# Patient Record
Sex: Male | Born: 1945
Health system: Southern US, Community
[De-identification: ages and names within clinical notes are randomized; demographics above are authoritative.]

## PROBLEM LIST (undated history)

## (undated) DIAGNOSIS — E7800 Pure hypercholesterolemia, unspecified: Secondary | ICD-10-CM

## (undated) DIAGNOSIS — K219 Gastro-esophageal reflux disease without esophagitis: Secondary | ICD-10-CM

## (undated) DIAGNOSIS — J189 Pneumonia, unspecified organism: Secondary | ICD-10-CM

## (undated) DIAGNOSIS — N3281 Overactive bladder: Secondary | ICD-10-CM

## (undated) DIAGNOSIS — J449 Chronic obstructive pulmonary disease, unspecified: Secondary | ICD-10-CM

## (undated) DIAGNOSIS — M1990 Unspecified osteoarthritis, unspecified site: Secondary | ICD-10-CM

## (undated) DIAGNOSIS — E1129 Type 2 diabetes mellitus with other diabetic kidney complication: Secondary | ICD-10-CM

## (undated) HISTORY — DX: Gastro-esophageal reflux disease without esophagitis: K21.9

## (undated) HISTORY — DX: Overactive bladder: N32.81

## (undated) HISTORY — PX: OTHER SURGICAL HISTORY: SHX169

## (undated) HISTORY — PX: CATARACT EXTRACTION: SUR2

## (undated) HISTORY — DX: Pure hypercholesterolemia, unspecified: E78.00

## (undated) HISTORY — DX: Type 2 diabetes mellitus with other diabetic kidney complication: E11.29

---

## 2004-09-04 ENCOUNTER — Encounter: Admission: RE | Admit: 2004-09-04 | Discharge: 2004-09-04 | Payer: Self-pay | Admitting: Family Medicine

## 2006-03-24 ENCOUNTER — Encounter: Admission: RE | Admit: 2006-03-24 | Discharge: 2006-03-24 | Payer: Self-pay | Admitting: General Surgery

## 2011-06-17 ENCOUNTER — Encounter (HOSPITAL_COMMUNITY): Payer: Self-pay | Admitting: Pharmacy Technician

## 2011-06-26 ENCOUNTER — Encounter (HOSPITAL_COMMUNITY): Payer: Self-pay

## 2011-06-26 ENCOUNTER — Encounter (HOSPITAL_COMMUNITY)
Admission: RE | Admit: 2011-06-26 | Discharge: 2011-06-26 | Disposition: A | Discharge status: Home / Self Care | Payer: Medicare Other | Source: Ambulatory Visit | Attending: Orthopaedic Surgery | Admitting: Orthopaedic Surgery

## 2011-06-26 ENCOUNTER — Other Ambulatory Visit: Payer: Self-pay

## 2011-06-26 ENCOUNTER — Ambulatory Visit (HOSPITAL_COMMUNITY)
Admission: RE | Admit: 2011-06-26 | Discharge: 2011-06-26 | Disposition: A | Discharge status: Home / Self Care | Payer: Medicare Other | Source: Ambulatory Visit | Attending: Orthopaedic Surgery | Admitting: Orthopaedic Surgery

## 2011-06-26 DIAGNOSIS — M1610 Unilateral primary osteoarthritis, unspecified hip: Secondary | ICD-10-CM | POA: Insufficient documentation

## 2011-06-26 DIAGNOSIS — Z01812 Encounter for preprocedural laboratory examination: Secondary | ICD-10-CM | POA: Insufficient documentation

## 2011-06-26 DIAGNOSIS — Z01810 Encounter for preprocedural cardiovascular examination: Secondary | ICD-10-CM | POA: Insufficient documentation

## 2011-06-26 DIAGNOSIS — M169 Osteoarthritis of hip, unspecified: Secondary | ICD-10-CM | POA: Insufficient documentation

## 2011-06-26 DIAGNOSIS — Z01818 Encounter for other preprocedural examination: Secondary | ICD-10-CM | POA: Insufficient documentation

## 2011-06-26 HISTORY — DX: Unspecified osteoarthritis, unspecified site: M19.90

## 2011-06-26 HISTORY — DX: Chronic obstructive pulmonary disease, unspecified: J44.9

## 2011-06-26 LAB — URINALYSIS, ROUTINE W REFLEX MICROSCOPIC
Bilirubin Urine: NEGATIVE
Glucose, UA: NEGATIVE mg/dL
Hgb urine dipstick: NEGATIVE
Leukocytes, UA: NEGATIVE
Nitrite: NEGATIVE
Protein, ur: NEGATIVE mg/dL
Specific Gravity, Urine: 1.016 (ref 1.005–1.030)
Urobilinogen, UA: 0.2 mg/dL (ref 0.0–1.0)
pH: 5 (ref 5.0–8.0)

## 2011-06-26 LAB — CBC
HCT: 42.2 % (ref 39.0–52.0)
Hemoglobin: 14.1 g/dL (ref 13.0–17.0)
MCH: 29.7 pg (ref 26.0–34.0)
MCHC: 33.4 g/dL (ref 30.0–36.0)
MCV: 88.8 fL (ref 78.0–100.0)
Platelets: 255 10*3/uL (ref 150–400)
RBC: 4.75 MIL/uL (ref 4.22–5.81)
RDW: 13.6 % (ref 11.5–15.5)
WBC: 8.2 10*3/uL (ref 4.0–10.5)

## 2011-06-26 LAB — PROTIME-INR
INR: 1.03 (ref 0.00–1.49)
Prothrombin Time: 13.7 seconds (ref 11.6–15.2)

## 2011-06-26 LAB — BASIC METABOLIC PANEL
BUN: 17 mg/dL (ref 6–23)
CO2: 27 mEq/L (ref 19–32)
Calcium: 9.2 mg/dL (ref 8.4–10.5)
Chloride: 102 mEq/L (ref 96–112)
Creatinine, Ser: 1.4 mg/dL — ABNORMAL HIGH (ref 0.50–1.35)
GFR calc Af Amer: 59 mL/min — ABNORMAL LOW (ref >90)
GFR calc non Af Amer: 51 mL/min — ABNORMAL LOW (ref >90)
Glucose, Bld: 73 mg/dL (ref 70–99)
Potassium: 4.7 mEq/L (ref 3.5–5.1)
Sodium: 137 mEq/L (ref 135–145)

## 2011-06-26 LAB — APTT: aPTT: 37 seconds (ref 24–37)

## 2011-06-26 LAB — SURGICAL PCR SCREEN
MRSA, PCR: NEGATIVE
Staphylococcus aureus: POSITIVE — AB

## 2011-06-26 NOTE — Patient Instructions (Addendum)
20 Christopher Turner  06/26/2011   Your procedure is scheduled on:  1.11.13 1015am-1235 pm  Report to [facility] at 0815 AM.  Call this number if you have problems the morning of surgery: [phone]   Remember:   Do not eat food:After Midnight.  May have clear liquids:until Midnight .  Clear liquids include soda, tea, black coffee, apple or grape juice, broth.  Take these medicines the morning of surgery with A SIP OF WATER:    Do not wear jewelry,   Do not wear lotions, powders, or perfumes.     Do not bring valuables to the hospital.  Contacts, dentures or bridgework may not be worn into surgery.  Leave suitcase in the car. After surgery it may be brought to your room.  For patients admitted to the hospital, checkout time is 11:00 AM the day of discharge.      Special Instructions: CHG Shower Use Special Wash: 1/2 bottle night before surgery and 1/2 bottle morning of surgery. shower chin to toes with CHG.  Wash face and private parts with regular soap.    Please read over the following fact sheets that you were given: MRSA Information, Blood Transfusion Fact Sheet , Incentive Spirometry Fact Sheet, coughing and deep breathing exercises, leg exercises

## 2011-07-03 ENCOUNTER — Inpatient Hospital Stay (HOSPITAL_COMMUNITY): Payer: Medicare Other

## 2011-07-03 ENCOUNTER — Encounter (HOSPITAL_COMMUNITY): Payer: Self-pay | Admitting: Anesthesiology

## 2011-07-03 ENCOUNTER — Inpatient Hospital Stay (HOSPITAL_COMMUNITY)
Admission: RE | Admit: 2011-07-03 | Discharge: 2011-07-06 | DRG: 470 | Disposition: A | Discharge status: Home Health | Payer: Medicare Other | Source: Ambulatory Visit | Attending: Orthopaedic Surgery | Admitting: Orthopaedic Surgery

## 2011-07-03 ENCOUNTER — Encounter (HOSPITAL_COMMUNITY): Payer: Self-pay | Admitting: *Deleted

## 2011-07-03 ENCOUNTER — Inpatient Hospital Stay (HOSPITAL_COMMUNITY): Payer: Medicare Other | Admitting: Anesthesiology

## 2011-07-03 ENCOUNTER — Encounter (HOSPITAL_COMMUNITY)
Admission: RE | Disposition: A | Discharge status: Home Health | Payer: Self-pay | Source: Ambulatory Visit | Attending: Orthopaedic Surgery

## 2011-07-03 DIAGNOSIS — J4489 Other specified chronic obstructive pulmonary disease: Secondary | ICD-10-CM | POA: Diagnosis present

## 2011-07-03 DIAGNOSIS — M1610 Unilateral primary osteoarthritis, unspecified hip: Principal | ICD-10-CM | POA: Diagnosis present

## 2011-07-03 DIAGNOSIS — J449 Chronic obstructive pulmonary disease, unspecified: Secondary | ICD-10-CM | POA: Diagnosis present

## 2011-07-03 DIAGNOSIS — M169 Osteoarthritis of hip, unspecified: Secondary | ICD-10-CM

## 2011-07-03 HISTORY — PX: TOTAL HIP ARTHROPLASTY: SHX124

## 2011-07-03 LAB — ABO/RH: ABO/RH(D): O POS

## 2011-07-03 LAB — TYPE AND SCREEN
ABO/RH(D): O POS
Antibody Screen: NEGATIVE

## 2011-07-03 SURGERY — ARTHROPLASTY, HIP, TOTAL, ANTERIOR APPROACH
Anesthesia: General | Site: Hip | Laterality: Right | Wound class: Clean

## 2011-07-03 MED ORDER — MORPHINE SULFATE (PF) 1 MG/ML IV SOLN
INTRAVENOUS | Status: DC
  Administered 2011-07-03: 3 mg via INTRAVENOUS
  Administered 2011-07-03: 1.5 mg via INTRAVENOUS
  Administered 2011-07-03: 13:00:00 via INTRAVENOUS
  Administered 2011-07-04: 3 mg via INTRAVENOUS
  Administered 2011-07-04: 6 mg via INTRAVENOUS
  Administered 2011-07-04: 1.5 mg via INTRAVENOUS
  Administered 2011-07-04: 6 mg via INTRAVENOUS
  Administered 2011-07-04: 3 mg via INTRAVENOUS
  Administered 2011-07-04: 09:00:00 via INTRAVENOUS
  Administered 2011-07-04 – 2011-07-05 (×2): 3 mg via INTRAVENOUS
  Filled 2011-07-03 (×2): qty 25

## 2011-07-03 MED ORDER — METHOCARBAMOL 100 MG/ML IJ SOLN
500.0000 mg | Freq: Four times a day (QID) | INTRAVENOUS | Status: DC | PRN
  Administered 2011-07-03: 500 mg via INTRAVENOUS
  Filled 2011-07-03: qty 5

## 2011-07-03 MED ORDER — SODIUM CHLORIDE 0.9 % IJ SOLN: 9.0000 mL | INTRAMUSCULAR | Status: DC | PRN

## 2011-07-03 MED ORDER — ONDANSETRON HCL 4 MG/2ML IJ SOLN: 4.0000 mg | Freq: Four times a day (QID) | INTRAMUSCULAR | Status: DC | PRN

## 2011-07-03 MED ORDER — MORPHINE SULFATE 2 MG/ML IJ SOLN: 1.0000 mg | INTRAMUSCULAR | Status: DC | PRN

## 2011-07-03 MED ORDER — RIVAROXABAN 10 MG PO TABS
10.0000 mg | ORAL_TABLET | Freq: Every day | ORAL | Status: DC
  Administered 2011-07-04 – 2011-07-06 (×3): 10 mg via ORAL
  Filled 2011-07-03 (×3): qty 1

## 2011-07-03 MED ORDER — NEOSTIGMINE METHYLSULFATE 1 MG/ML IJ SOLN
INTRAMUSCULAR | Status: DC | PRN
  Administered 2011-07-03: 4 mg via INTRAVENOUS

## 2011-07-03 MED ORDER — PROMETHAZINE HCL 25 MG/ML IJ SOLN: 6.2500 mg | INTRAMUSCULAR | Status: DC | PRN

## 2011-07-03 MED ORDER — ALUM & MAG HYDROXIDE-SIMETH 200-200-20 MG/5ML PO SUSP
30.0000 mL | ORAL | Status: DC | PRN
  Administered 2011-07-03: 30 mL via ORAL
  Filled 2011-07-03: qty 30

## 2011-07-03 MED ORDER — DOCUSATE SODIUM 100 MG PO CAPS
100.0000 mg | ORAL_CAPSULE | Freq: Two times a day (BID) | ORAL | Status: DC
  Administered 2011-07-03 – 2011-07-06 (×7): 100 mg via ORAL
  Filled 2011-07-03 (×8): qty 1

## 2011-07-03 MED ORDER — ACETAMINOPHEN 325 MG PO TABS: 650.0000 mg | ORAL_TABLET | Freq: Four times a day (QID) | ORAL | Status: DC | PRN

## 2011-07-03 MED ORDER — HYDROMORPHONE HCL PF 1 MG/ML IJ SOLN
0.2500 mg | INTRAMUSCULAR | Status: DC | PRN
  Administered 2011-07-03 (×2): 0.5 mg via INTRAVENOUS

## 2011-07-03 MED ORDER — MIDAZOLAM HCL 5 MG/5ML IJ SOLN
INTRAMUSCULAR | Status: DC | PRN
  Administered 2011-07-03: 2 mg via INTRAVENOUS

## 2011-07-03 MED ORDER — PHENOL 1.4 % MT LIQD: 1.0000 | OROMUCOSAL | Status: DC | PRN

## 2011-07-03 MED ORDER — MENTHOL 3 MG MT LOZG: 1.0000 | LOZENGE | OROMUCOSAL | Status: DC | PRN

## 2011-07-03 MED ORDER — METOCLOPRAMIDE HCL 5 MG/ML IJ SOLN
5.0000 mg | Freq: Three times a day (TID) | INTRAMUSCULAR | Status: DC | PRN
Start: 2011-07-03 — End: 2011-07-06

## 2011-07-03 MED ORDER — LACTATED RINGERS IV SOLN: INTRAVENOUS | Status: DC

## 2011-07-03 MED ORDER — CEFAZOLIN SODIUM 1-5 GM-% IV SOLN
1.0000 g | Freq: Four times a day (QID) | INTRAVENOUS | Status: AC
  Administered 2011-07-03 – 2011-07-04 (×3): 1 g via INTRAVENOUS
  Filled 2011-07-03 (×3): qty 50

## 2011-07-03 MED ORDER — EPHEDRINE SULFATE 50 MG/ML IJ SOLN
INTRAMUSCULAR | Status: DC | PRN
  Administered 2011-07-03 (×2): 10 mg via INTRAVENOUS

## 2011-07-03 MED ORDER — DIPHENHYDRAMINE HCL 12.5 MG/5ML PO ELIX: 12.5000 mg | ORAL_SOLUTION | ORAL | Status: DC | PRN

## 2011-07-03 MED ORDER — METHOCARBAMOL 500 MG PO TABS
500.0000 mg | ORAL_TABLET | Freq: Four times a day (QID) | ORAL | Status: DC | PRN
  Administered 2011-07-04: 500 mg via ORAL
  Filled 2011-07-03: qty 1

## 2011-07-03 MED ORDER — MEPERIDINE HCL 50 MG/ML IJ SOLN: 6.2500 mg | INTRAMUSCULAR | Status: DC | PRN

## 2011-07-03 MED ORDER — GLYCOPYRROLATE 0.2 MG/ML IJ SOLN
INTRAMUSCULAR | Status: DC | PRN
  Administered 2011-07-03: .6 mg via INTRAVENOUS

## 2011-07-03 MED ORDER — CEFAZOLIN SODIUM-DEXTROSE 2-3 GM-% IV SOLR
2.0000 g | Freq: Once | INTRAVENOUS | Status: AC
  Administered 2011-07-03: 2 g via INTRAVENOUS

## 2011-07-03 MED ORDER — SODIUM CHLORIDE 0.9 % IV SOLN
INTRAVENOUS | Status: DC
  Administered 2011-07-03 – 2011-07-04 (×3): via INTRAVENOUS

## 2011-07-03 MED ORDER — HYDROCODONE-ACETAMINOPHEN 5-325 MG PO TABS: 1.0000 | ORAL_TABLET | ORAL | Status: DC | PRN

## 2011-07-03 MED ORDER — METOCLOPRAMIDE HCL 10 MG PO TABS
5.0000 mg | ORAL_TABLET | Freq: Three times a day (TID) | ORAL | Status: DC | PRN
Start: 2011-07-03 — End: 2011-07-06

## 2011-07-03 MED ORDER — KETAMINE HCL 10 MG/ML IJ SOLN
INTRAMUSCULAR | Status: DC | PRN
  Administered 2011-07-03 (×2): 10 mg via INTRAVENOUS
  Administered 2011-07-03: 20 mg via INTRAVENOUS

## 2011-07-03 MED ORDER — ZOLPIDEM TARTRATE 5 MG PO TABS: 5.0000 mg | ORAL_TABLET | Freq: Every evening | ORAL | Status: DC | PRN

## 2011-07-03 MED ORDER — ACETAMINOPHEN 650 MG RE SUPP: 650.0000 mg | Freq: Four times a day (QID) | RECTAL | Status: DC | PRN

## 2011-07-03 MED ORDER — ONDANSETRON HCL 4 MG/2ML IJ SOLN
INTRAMUSCULAR | Status: DC | PRN
  Administered 2011-07-03: 4 mg via INTRAVENOUS

## 2011-07-03 MED ORDER — ACETAMINOPHEN 10 MG/ML IV SOLN
INTRAVENOUS | Status: DC | PRN
  Administered 2011-07-03: 1000 mg via INTRAVENOUS

## 2011-07-03 MED ORDER — CISATRACURIUM BESYLATE 2 MG/ML IV SOLN
INTRAVENOUS | Status: DC | PRN
  Administered 2011-07-03: 2 mg via INTRAVENOUS
  Administered 2011-07-03: 12 mg via INTRAVENOUS
  Administered 2011-07-03: 2 mg via INTRAVENOUS

## 2011-07-03 MED ORDER — 0.9 % SODIUM CHLORIDE (POUR BTL) OPTIME
TOPICAL | Status: DC | PRN
  Administered 2011-07-03: 1000 mL

## 2011-07-03 MED ORDER — DIPHENHYDRAMINE HCL 12.5 MG/5ML PO ELIX: 12.5000 mg | ORAL_SOLUTION | Freq: Four times a day (QID) | ORAL | Status: DC | PRN

## 2011-07-03 MED ORDER — OXYCODONE HCL 5 MG PO TABS
5.0000 mg | ORAL_TABLET | ORAL | Status: DC | PRN
Start: 2011-07-03 — End: 2011-07-06

## 2011-07-03 MED ORDER — PROPOFOL 10 MG/ML IV EMUL
INTRAVENOUS | Status: DC | PRN
  Administered 2011-07-03: 150 mg via INTRAVENOUS

## 2011-07-03 MED ORDER — LACTATED RINGERS IV SOLN
INTRAVENOUS | Status: DC
  Administered 2011-07-03 (×2): via INTRAVENOUS
  Administered 2011-07-03: 1000 mL via INTRAVENOUS

## 2011-07-03 MED ORDER — DIPHENHYDRAMINE HCL 50 MG/ML IJ SOLN: 12.5000 mg | Freq: Four times a day (QID) | INTRAMUSCULAR | Status: DC | PRN

## 2011-07-03 MED ORDER — SUFENTANIL CITRATE 50 MCG/ML IV SOLN
INTRAVENOUS | Status: DC | PRN
  Administered 2011-07-03: 20 ug via INTRAVENOUS
  Administered 2011-07-03 (×3): 10 ug via INTRAVENOUS

## 2011-07-03 MED ORDER — NALOXONE HCL 0.4 MG/ML IJ SOLN: 0.4000 mg | INTRAMUSCULAR | Status: DC | PRN

## 2011-07-03 MED ORDER — LIDOCAINE HCL (CARDIAC) 20 MG/ML IV SOLN
INTRAVENOUS | Status: DC | PRN
  Administered 2011-07-03: 100 mg via INTRAVENOUS

## 2011-07-03 MED ORDER — ONDANSETRON HCL 4 MG PO TABS: 4.0000 mg | ORAL_TABLET | Freq: Four times a day (QID) | ORAL | Status: DC | PRN

## 2011-07-03 SURGICAL SUPPLY — 34 items
BAG ZIPLOCK 12X15 (MISCELLANEOUS) ×4 IMPLANT
BLADE SAW SGTL 18X1.27X75 (BLADE) ×2 IMPLANT
CELLS DAT CNTRL 66122 CELL SVR (MISCELLANEOUS) ×1 IMPLANT
CLOTH BEACON ORANGE TIMEOUT ST (SAFETY) ×2 IMPLANT
DRAPE C-ARM 42X72 X-RAY (DRAPES) ×2 IMPLANT
DRAPE STERI IOBAN 125X83 (DRAPES) ×2 IMPLANT
DRAPE U-SHAPE 47X51 STRL (DRAPES) ×6 IMPLANT
DRSG MEPILEX BORDER 4X8 (GAUZE/BANDAGES/DRESSINGS) ×2 IMPLANT
DURAPREP 26ML APPLICATOR (WOUND CARE) ×2 IMPLANT
ELECT BLADE TIP CTD 4 INCH (ELECTRODE) ×2 IMPLANT
ELECT REM PT RETURN 9FT ADLT (ELECTROSURGICAL) ×2
ELECTRODE REM PT RTRN 9FT ADLT (ELECTROSURGICAL) ×1 IMPLANT
FACESHIELD LNG OPTICON STERILE (SAFETY) ×8 IMPLANT
GAUZE XEROFORM 1X8 LF (GAUZE/BANDAGES/DRESSINGS) ×2 IMPLANT
GLOVE BIO SURGEON STRL SZ7 (GLOVE) ×2 IMPLANT
GLOVE BIO SURGEON STRL SZ7.5 (GLOVE) ×2 IMPLANT
GLOVE BIOGEL PI IND STRL 7.5 (GLOVE) IMPLANT
GLOVE BIOGEL PI IND STRL 8 (GLOVE) ×1 IMPLANT
GLOVE BIOGEL PI INDICATOR 7.5 (GLOVE)
GLOVE BIOGEL PI INDICATOR 8 (GLOVE) ×1
GLOVE ECLIPSE 7.0 STRL STRAW (GLOVE) ×2 IMPLANT
GOWN STRL REIN XL XLG (GOWN DISPOSABLE) ×4 IMPLANT
KIT BASIN OR (CUSTOM PROCEDURE TRAY) ×2 IMPLANT
PACK TOTAL JOINT (CUSTOM PROCEDURE TRAY) ×2 IMPLANT
PADDING CAST COTTON 6X4 STRL (CAST SUPPLIES) ×2 IMPLANT
RTRCTR WOUND ALEXIS 18CM MED (MISCELLANEOUS) ×2
STAPLER SKIN PROX WIDE 3.9 (STAPLE) ×2 IMPLANT
SUT ETHIBOND NAB CT1 #1 30IN (SUTURE) ×4 IMPLANT
SUT VIC AB 1 CT1 36 (SUTURE) ×4 IMPLANT
SUT VIC AB 2-0 CT1 27 (SUTURE) ×2
SUT VIC AB 2-0 CT1 TAPERPNT 27 (SUTURE) ×2 IMPLANT
TOWEL OR 17X26 10 PK STRL BLUE (TOWEL DISPOSABLE) ×4 IMPLANT
TOWEL OR NON WOVEN STRL DISP B (DISPOSABLE) ×2 IMPLANT
TRAY FOLEY CATH 14FRSI W/METER (CATHETERS) ×2 IMPLANT

## 2011-07-03 NOTE — Anesthesia Postprocedure Evaluation (Signed)
  Anesthesia Post-op Note  Patient: Christopher Turner  Procedure(s) Performed:  TOTAL HIP ARTHROPLASTY ANTERIOR APPROACH  Patient Location: PACU  Anesthesia Type: General  Level of Consciousness: awake and alert   Airway and Oxygen Therapy: Patient Spontanous Breathing  Post-op Pain: mild  Post-op Assessment: Post-op Vital signs reviewed, Patient's Cardiovascular Status Stable, Respiratory Function Stable, Patent Airway and No signs of Nausea or vomiting  Post-op Vital Signs: stable  Complications: No apparent anesthesia complications

## 2011-07-03 NOTE — Transfer of Care (Signed)
Immediate Anesthesia Transfer of Care Note  Patient: Christopher Turner  Procedure(s) Performed:  TOTAL HIP ARTHROPLASTY ANTERIOR APPROACH  Patient Location: PACU  Anesthesia Type: General  Level of Consciousness: sedated  Airway & Oxygen Therapy: Patient Spontanous Breathing and Patient connected to face mask oxygen  Post-op Assessment: Report given to PACU RN and Post -op Vital signs reviewed and stable  Post vital signs: Reviewed and stable Filed Vitals:   07/03/11 0730  BP: 136/82  Pulse: 67  Temp: 36.6 C  Resp: 20    Complications: No apparent anesthesia complications

## 2011-07-03 NOTE — Anesthesia Procedure Notes (Signed)
Procedure Name: Intubation Date/Time: 07/03/2011 10:09 AM Performed by: Lurlean Leyden, Mycah Formica L. Patient Re-evaluated:Patient Re-evaluated prior to inductionOxygen Delivery Method: Circle System Utilized Preoxygenation: Pre-oxygenation with 100% oxygen Intubation Type: IV induction Ventilation: Mask ventilation without difficulty and Oral airway inserted - appropriate to patient size Laryngoscope Size: Miller and 3 Grade View: Grade I Tube type: Oral Tube size: 8.0 mm Number of attempts: 1 Airway Equipment and Method: stylet Placement Confirmation: ETT inserted through vocal cords under direct vision,  breath sounds checked- equal and bilateral and positive ETCO2 Secured at: 22 cm Tube secured with: Tape Dental Injury: Teeth and Oropharynx as per pre-operative assessment

## 2011-07-03 NOTE — Preoperative (Signed)
Beta Blockers   Reason not to administer Beta Blockers:Not Applicable 

## 2011-07-03 NOTE — Anesthesia Preprocedure Evaluation (Addendum)
Anesthesia Evaluation  Patient identified by MRN, date of birth, ID band Patient awake    Reviewed: Allergy & Precautions, H&P , NPO status , Patient's Chart, lab work & pertinent test results  Airway Mallampati: II TM Distance: >3 FB Neck ROM: Full    Dental No notable dental hx. (+) Partial Upper and Partial Lower   Pulmonary neg pulmonary ROS, COPD (mild) clear to auscultation  Pulmonary exam normal       Cardiovascular neg cardio ROS Regular Normal    Neuro/Psych Negative Neurological ROS  Negative Psych ROS   GI/Hepatic negative GI ROS, Neg liver ROS,   Endo/Other  Negative Endocrine ROS  Renal/GU negative Renal ROS  Genitourinary negative   Musculoskeletal negative musculoskeletal ROS (+)   Abdominal   Peds negative pediatric ROS (+)  Hematology negative hematology ROS (+)   Anesthesia Other Findings   Reproductive/Obstetrics negative OB ROS                          Anesthesia Physical Anesthesia Plan  ASA: II  Anesthesia Plan: General   Post-op Pain Management:    Induction: Intravenous  Airway Management Planned: Oral ETT  Additional Equipment:   Intra-op Plan:   Post-operative Plan: Extubation in OR  Informed Consent: I have reviewed the patients History and Physical, chart, labs and discussed the procedure including the risks, benefits and alternatives for the proposed anesthesia with the patient or authorized representative who has indicated his/her understanding and acceptance.   Dental advisory given  Plan Discussed with: CRNA  Anesthesia Plan Comments:         Anesthesia Quick Evaluation

## 2011-07-03 NOTE — Brief Op Note (Signed)
07/03/2011  12:02 PM  PATIENT:  Lamar Blinks  66 y.o. male  PRE-OPERATIVE DIAGNOSIS:  Right Hip Osteoarthritis/Degenerative Joint Disease  POST-OPERATIVE DIAGNOSIS:  right hip osteoarthritis/degenerative joint  PROCEDURE:  Procedure(s): TOTAL HIP ARTHROPLASTY ANTERIOR APPROACH  SURGEON:  Surgeon(s): Kathryne Hitch  PHYSICIAN ASSISTANT:   ASSISTANTS: none   ANESTHESIA:   general  EBL:  Total I/O In: 2000 [I.V.:2000] Out: 975 [Urine:375; Blood:600]  BLOOD ADMINISTERED:none  DRAINS: none   LOCAL MEDICATIONS USED:  NONE  SPECIMEN:  No Specimen  DISPOSITION OF SPECIMEN:  N/A  COUNTS:  YES  TOURNIQUET:  * No tourniquets in log *  DICTATION: .Other Dictation: Dictation Number [id number]  PLAN OF CARE: Admit to inpatient   PATIENT DISPOSITION:  PACU - hemodynamically stable.   Delay start of Pharmacological VTE agent (>24hrs) due to surgical blood loss or risk of bleeding:  {YES/NO/NOT APPLICABLE:20182

## 2011-07-03 NOTE — H&P (Signed)
Christopher Turner is an 66 y.o. male.   Chief Complaint:   Severe right hip pain with known end-stage OA HPI: 66 yo with right hip pain and bone-on-bone wear on x-ray.  Failed conservative non-op tx.  Wishes to proceed with a right total hip replaecment.  Understands fully the risks and benefits.  Past Medical History  Diagnosis Date  . COPD (chronic obstructive pulmonary disease)     23 yrs ago dx wtih COPD no problems now   . Arthritis     arthritis right hip     Past Surgical History  Procedure Date  . Other surgical history     trapped nerve surgery right scapula as teenager   . Other surgical history     vesicocele     No family history on file. Social History:  reports that he has quit smoking. He has never used smokeless tobacco. He reports that he does not drink alcohol or use illicit drugs.  Allergies:  Allergies  Allergen Reactions  . Sulfa Antibiotics Anaphylaxis    Medications Prior to Admission  Medication Dose Route Frequency Provider Last Rate Last Dose  . ceFAZolin (ANCEF) IVPB 2 g/50 mL premix  2 g Intravenous Once Kathryne Hitch       No current outpatient prescriptions on file as of 07/03/2011.    No results found for this or any previous visit (from the past 48 hour(s)). No results found.  Review of Systems  All other systems reviewed and are negative.    Blood pressure 136/82, pulse 67, temperature 97.8 F (36.6 C), resp. rate 20, SpO2 97.00%. Physical Exam  Constitutional: He is oriented to person, place, and time. He appears well-developed and well-nourished.  HENT:  Head: Normocephalic and atraumatic.  Eyes: EOM are normal. Pupils are equal, round, and reactive to light.  Neck: Normal range of motion. Neck supple.  Cardiovascular: Normal rate, regular rhythm and normal heart sounds.   Respiratory: Effort normal and breath sounds normal.  GI: Soft. Bowel sounds are normal.  Musculoskeletal:       Right hip: He exhibits decreased range  of motion, bony tenderness and crepitus.  Neurological: He is alert and oriented to person, place, and time.  Skin: Skin is warm and dry.  Psychiatric: He has a normal mood and affect.     Assessment/Plan To the OR today for a right total hip replacement then admission as an inpatient.  Murle Otting Y 07/03/2011, 7:41 AM

## 2011-07-04 LAB — BASIC METABOLIC PANEL
BUN: 14 mg/dL (ref 6–23)
CO2: 28 mEq/L (ref 19–32)
Calcium: 8 mg/dL — ABNORMAL LOW (ref 8.4–10.5)
Chloride: 104 mEq/L (ref 96–112)
Creatinine, Ser: 1.16 mg/dL (ref 0.50–1.35)
GFR calc Af Amer: 75 mL/min — ABNORMAL LOW (ref >90)
GFR calc non Af Amer: 64 mL/min — ABNORMAL LOW (ref >90)
Glucose, Bld: 133 mg/dL — ABNORMAL HIGH (ref 70–99)
Potassium: 3.6 mEq/L (ref 3.5–5.1)
Sodium: 137 mEq/L (ref 135–145)

## 2011-07-04 LAB — CBC
HCT: 32.4 % — ABNORMAL LOW (ref 39.0–52.0)
Hemoglobin: 10.8 g/dL — ABNORMAL LOW (ref 13.0–17.0)
MCH: 29.4 pg (ref 26.0–34.0)
MCHC: 33.3 g/dL (ref 30.0–36.0)
MCV: 88.3 fL (ref 78.0–100.0)
Platelets: 200 10*3/uL (ref 150–400)
RBC: 3.67 MIL/uL — ABNORMAL LOW (ref 4.22–5.81)
RDW: 13.7 % (ref 11.5–15.5)
WBC: 7.1 10*3/uL (ref 4.0–10.5)

## 2011-07-04 NOTE — Progress Notes (Signed)
CM spoke with pt concerning d/c planning. Per pt choice [facility] to provide HHPT. [facility] rep Talmadge Coventry notified of referral. Per pt has home DME, no other HH services needed. Spouse to assist in home care.  Leonie Green [phone]

## 2011-07-04 NOTE — Progress Notes (Signed)
Subjective: 1 Day Post-Op Procedure(s) (LRB): TOTAL HIP ARTHROPLASTY ANTERIOR APPROACH (Right) Patient was out of bed to bathroom last night    Objective: Vital signs in last 24 hours: Temp:  [97.2 F (36.2 C)-98.4 F (36.9 C)] 98.4 F (36.9 C) (01/11 2120) Pulse Rate:  [61-91] 91  (01/11 2120) Resp:  [9-20] 14  (01/12 0412) BP: (105-136)/(63-83) 107/68 mmHg (01/11 2120) SpO2:  [94 %-100 %] 97 % (01/12 0412) FiO2 (%):  [95 %] 95 % (01/11 1953) Weight:  [81.194 kg (179 lb)] 81.194 kg (179 lb) (01/11 1330)  Intake/Output from previous day: 01/11 0701 - 01/12 0700 In: 3275 [P.O.:400; I.V.:2700; IV Piggyback:100] Out: 1575 [Urine:975; Blood:600] Intake/Output this shift: Total I/O In: -  Out: 600 [Urine:600]  No results found for this basename: HGB:5 in the last 72 hours No results found for this basename: WBC:2,RBC:2,HCT:2,PLT:2 in the last 72 hours No results found for this basename: NA:2,K:2,CL:2,CO2:2,BUN:2,CREATININE:2,GLUCOSE:2,CALCIUM:2 in the last 72 hours No results found for this basename: LABPT:2,INR:2 in the last 72 hours  Neurologically intact  Assessment/Plan: 1 Day Post-Op Procedure(s) (LRB): TOTAL HIP ARTHROPLASTY ANTERIOR APPROACH (Right) Up with therapy  Kada Friesen V 07/04/2011, 5:17 AM

## 2011-07-04 NOTE — Progress Notes (Signed)
Physical Therapy Evaluation Patient Details Name: Christopher Turner MRN: [id number] DOB: 1945/12/10 Today's Date: 07/04/2011 11:57-12:20 PT Eval 2  Problem List:  Patient Active Problem List  Diagnoses  . Degenerative arthritis of hip    Past Medical History:  Past Medical History  Diagnosis Date  . COPD (chronic obstructive pulmonary disease)     23 yrs ago dx wtih COPD no problems now   . Arthritis     arthritis right hip    Past Surgical History:  Past Surgical History  Procedure Date  . Other surgical history     trapped nerve surgery right scapula as teenager   . Other surgical history     vesicocele     PT Assessment/Plan/Recommendation PT Assessment Clinical Impression Statement: Pt doing very well for POD 1. Expect good progress. Pt reports stiffness in R hip decreased with walking & that he was pleasantly surprised with how far he could walk.  PT Recommendation/Assessment: Patient will need skilled PT in the acute care venue PT Problem List: Decreased strength;Decreased activity tolerance;Decreased mobility;Decreased knowledge of use of DME;Pain PT Therapy Diagnosis : Difficulty walking;Acute pain PT Plan PT Frequency: 7X/week PT Treatment/Interventions: DME instruction;Gait training;Therapeutic activities;Therapeutic exercise;Functional mobility training;Patient/family education PT Recommendation Recommendations for Other Services: OT consult Follow Up Recommendations: Home health PT (if needed) Equipment Recommended: None recommended by PT PT Goals  Acute Rehab PT Goals PT Goal Formulation: With patient Time For Goal Achievement: 4 days Pt will go Supine/Side to Sit: with modified independence PT Goal: Supine/Side to Sit - Progress: Not met Pt will go Sit to Stand: with modified independence PT Goal: Sit to Stand - Progress: Not met Pt will go Stand to Sit: with modified independence;with upper extremity assist PT Goal: Stand to Sit - Progress: Not met Pt  will Ambulate: >150 feet;with modified independence;with rolling walker PT Goal: Ambulate - Progress: Not met Pt will Perform Home Exercise Program: Independently PT Goal: Perform Home Exercise Program - Progress: Not met  PT Evaluation Precautions/Restrictions  Precautions Precaution Comments: none-direct anterior hip Required Braces or Orthoses: No Restrictions Weight Bearing Restrictions: No (WBAT) Prior Functioning  Home Living Lives With: Spouse Receives Help From: Family Type of Home: House Home Layout: One level Home Access: Level entry Home Adaptive Equipment: Walker - rolling;Tub transfer bench;Bedside commode/3-in-1 Prior Function Level of Independence: Independent with basic ADLs;Independent with gait;Independent with transfers Able to Take Stairs?: Yes Driving: Yes Vocation: Full time employment Vocation RequirementsHydrologist at [employer] Cognition Arousal/Alertness: Awake/alert Overall Cognitive Status: Appears within functional limits for tasks assessed Orientation Level: Oriented X4 Sensation/Coordination Sensation Light Touch: Appears Intact Coordination Gross Motor Movements are Fluid and Coordinated: Yes Fine Motor Movements are Fluid and Coordinated: Yes Extremity Assessment RUE Assessment RUE Assessment: Within Functional Limits LUE Assessment LUE Assessment: Within Functional Limits RLE Assessment RLE Assessment: Exceptions to [facility] RLE Strength Right Hip Flexion: 2-/5 LLE Assessment LLE Assessment: Within Functional Limits Mobility (including Balance) Bed Mobility Bed Mobility: Yes Supine to Sit: 4: Min assist;HOB flat;With rails Supine to Sit Details (indicate cue type and reason): pt 80%, used LLE to assist RLE Transfers Transfers: Yes Sit to Stand: 4: Min assist;From bed;From elevated surface;With upper extremity assist Sit to Stand Details (indicate cue type and reason): pt 75% Stand to Sit: 5: Supervision;To chair/3-in-1;With  armrests;With upper extremity assist Stand to Sit Details: VCs hand placement Ambulation/Gait Ambulation/Gait: Yes Ambulation/Gait Assistance: 5: Supervision Ambulation/Gait Assistance Details (indicate cue type and reason): VCs for sequencing and for flexed neck  Ambulation Distance (Feet): 85 Feet Assistive device: Rolling walker Gait Pattern: Step-to pattern    Exercise    End of Session PT - End of Session Equipment Utilized During Treatment: Gait belt Activity Tolerance: Patient tolerated treatment well Patient left: in chair Nurse Communication: Mobility status for transfers;Mobility status for ambulation General Behavior During Session: [facility] for tasks performed Cognition: [facility] for tasks performed  Tamala Ser 07/04/2011, 1:39 PM  Tamala Ser PT 07/04/2011  [phone]

## 2011-07-04 NOTE — Progress Notes (Signed)
Physical Therapy Treatment Patient Details Name: Christopher Turner MRN: [id number] DOB: 1946/01/03 Today's Date: 07/04/2011 14:00-14:10 TE  PT Assessment/Plan  PT - Assessment/Plan Comments on Treatment Session: Pt fatigued and wants to wait on walking. Performed bed exercises. PT Plan: Discharge plan remains appropriate PT Frequency: 7X/week Recommendations for Other Services: OT consult Follow Up Recommendations: Home health PT Equipment Recommended: None recommended by PT PT Goals  Acute Rehab PT Goals PT Goal Formulation: With patient Time For Goal Achievement: 4 days Pt will go Supine/Side to Sit: with modified independence PT Goal: Supine/Side to Sit - Progress: Not met Pt will go Sit to Stand: with modified independence PT Goal: Sit to Stand - Progress: Not met Pt will go Stand to Sit: with modified independence;with upper extremity assist PT Goal: Stand to Sit - Progress: Not met Pt will Ambulate: >150 feet;with modified independence;with rolling walker PT Goal: Ambulate - Progress: Not met Pt will Perform Home Exercise Program: Independently PT Goal: Perform Home Exercise Program - Progress: Not met  PT Treatment Precautions/Restrictions  Precautions Precaution Comments: none-direct anterior hip Required Braces or Orthoses: No Restrictions Weight Bearing Restrictions: No Mobility (including Balance) Bed Mobility Bed Mobility: Yes Supine to Sit: 4: Min assist;HOB flat;With rails Supine to Sit Details (indicate cue type and reason): pt 80%, used LLE to assist RLE Transfers Transfers: Yes Sit to Stand: 4: Min assist;From bed;From elevated surface;With upper extremity assist Sit to Stand Details (indicate cue type and reason): pt 75% Stand to Sit: 5: Supervision;To chair/3-in-1;With armrests;With upper extremity assist Stand to Sit Details: VCs hand placement Ambulation/Gait Ambulation/Gait: No (pt tired and wants to rest) Ambulation/Gait Assistance: 5:  Supervision Ambulation/Gait Assistance Details (indicate cue type and reason): VCs for sequencing and for flexed neck Ambulation Distance (Feet): 85 Feet Assistive device: Rolling walker Gait Pattern: Step-to pattern    Exercise  Total Joint Exercises Ankle Circles/Pumps: AROM;Both;10 reps;Supine Quad Sets: AROM;Right;5 reps;Supine Short Arc Quad: AROM;Right;10 reps;Supine Heel Slides: AAROM;Right;15 reps;Supine Hip ABduction/ADduction: AAROM;Right;10 reps;Supine Long Arc Quad: AROM;Right;10 reps;Seated End of Session PT - End of Session Equipment Utilized During Treatment: Gait belt Activity Tolerance: Patient tolerated treatment well Patient left: in bed;with call bell in reach Nurse Communication: Mobility status for transfers;Mobility status for ambulation General Behavior During Session: [facility] for tasks performed Cognition: [facility] for tasks performed  Tamala Ser 07/04/2011, 2:16 PM Tamala Ser PT 07/04/2011  [phone]

## 2011-07-05 LAB — CBC
HCT: 33.9 % — ABNORMAL LOW (ref 39.0–52.0)
Hemoglobin: 11.4 g/dL — ABNORMAL LOW (ref 13.0–17.0)
MCH: 29.9 pg (ref 26.0–34.0)
MCHC: 33.6 g/dL (ref 30.0–36.0)
MCV: 89 fL (ref 78.0–100.0)
Platelets: 192 10*3/uL (ref 150–400)
RBC: 3.81 MIL/uL — ABNORMAL LOW (ref 4.22–5.81)
RDW: 13.8 % (ref 11.5–15.5)
WBC: 8.9 10*3/uL (ref 4.0–10.5)

## 2011-07-05 NOTE — Progress Notes (Signed)
Subjective: 2 Days Post-Op Procedure(s) (LRB): TOTAL HIP ARTHROPLASTY ANTERIOR APPROACH (Right) Patient feels good with ambulation    Objective: Vital signs in last 24 hours: Temp:  [99.4 F (37.4 C)-99.8 F (37.7 C)] 99.8 F (37.7 C) (01/12 2140) Pulse Rate:  [90-102] 102  (01/12 2140) Resp:  [16-18] 18  (01/12 2140) BP: (111-137)/(70-81) 137/81 mmHg (01/12 2140) SpO2:  [90 %-98 %] 92 % (01/13 0657)  Intake/Output from previous day: 01/12 0701 - 01/13 0700 In: 2062.8 [P.O.:720; I.V.:1342.8] Out: 3050 [Urine:3050] Intake/Output this shift:     Basename 07/05/11 0522 07/04/11 0610  HGB 11.4* 10.8*    Basename 07/05/11 0522 07/04/11 0610  WBC 8.9 7.1  RBC 3.81* 3.67*  HCT 33.9* 32.4*  PLT 192 200    Basename 07/04/11 0610  NA 137  K 3.6  CL 104  CO2 28  BUN 14  CREATININE 1.16  GLUCOSE 133*  CALCIUM 8.0*   No results found for this basename: LABPT:2,INR:2 in the last 72 hours  Neurologically intact  Assessment/Plan: 2 Days Post-Op Procedure(s) (LRB): TOTAL HIP ARTHROPLASTY ANTERIOR APPROACH (Right) Up with therapy Plan for d/c Monday D/c iv and pca  Christopher Turner V 07/05/2011, 7:11 AM

## 2011-07-05 NOTE — Op Note (Signed)
NAMEABRAM, SAX                 ACCOUNT NO.:  [id number]  MEDICAL RECORD NO.:  [id number]  LOCATION:  1615                         FACILITY:  [facility]  PHYSICIAN:  Vanita Panda. Magnus Ivan, M.D.DATE OF BIRTH:  1945/09/02  DATE OF PROCEDURE:  07/03/2011 DATE OF DISCHARGE:                              OPERATIVE REPORT   PREOPERATIVE DIAGNOSIS:  Severe osteoarthritis and degenerative joint disease, left hip.  POSTOPERATIVE DIAGNOSIS:  Severe osteoarthritis and degenerative joint disease, left hip.  PROCEDURE:  Left total hip arthroplasty through direct anterior approach.  IMPLANTS:  DePuy Sector Gription acetabular component, size 54, size 36 + 4 neutral polyethylene liner, size 11 Corail femoral component with standard offset (KA), size 36 - 2 metal hip ball.  SURGEON:  Vanita Panda. Magnus Ivan, M.D.  ANESTHESIA:  General.  BLOOD LOSS:  600 cc.  COMPLICATIONS:  None.  INDICATIONS:  Mr. Halling is a very active 66 year old gentleman with endstage arthritis involving his right hip.  It is hurting for sometime now and has gotten progressively worse.  He wished to proceed with a total hip arthroplasty given the risk and benefits of surgery, which he fully understands.  He understands the potential for acute blood loss anemia, DVT, and PE and he wishes to proceed with the surgery.  The goals are increased activity, decreased pain, and improved quality of life.  The risks and benefits again has been well-understood and he does wish to proceed.  PROCEDURE DESCRIPTION:  After informed consent was obtained, the appropriate right hip was marked.  He was taken to the operating room. General anesthesia was obtained while he was on a stretcher and a Foley catheter was placed and then he was placed in traction boots and put supine on the Hanna fracture table with both feet in in-line skeletal traction and no traction applied.  A perineal post was placed as well. His right hip was then  prepped and draped with DuraPrep and sterile drapes.  A time-out was called and he was identified as the correct patient and correct right hip.  I then made incision just distal and posterior to the anterosuperior iliac spine and carried this obliquely down the leg.  I then proceeded to dissect through the tensor fascia lata and proceeded with a direct anterior approach to the hip.  A Cobra retractor was placed around the lateral neck and then up under the rectus femoris, a medial retractor was placed.  I coagulated lateral femoral circumflex vessels and then I made my capsular hip incision and put the Cobra retractors within the capsular hip incision.  Next, I used oscillating saw to make my femoral neck cut proximal to the lesser trochanter and then I completed this with an osteotome.  I put a cord screw guide in the femoral head and removed this in its entirety.  I then cleaned the acetabulum from debris including soft tissue and began reaming from size 44 all the way up to a size 53 reamer with the last 2 reamers placed under direct fluoroscopy and visualization as well.  I then placed the real size 54 acetabular component and a hole eliminator guide.  I then placed the real size 54  polyethylene liner.  The acetabulum again was viewed under direct fluoroscopy to get my inclination and anteversion.  I then turned attention to the femur.  All traction was off the leg and a temporary hook was placed within the vastus just underneath the femur.  The hip was externally rotated to 90 degrees, extended, and adducted to allow access to the femoral canal.  I cleaned the proximal femur debris including releasing the lateral capsule and piriformis.  I then began broaching from a size 8 broach all the way up to a size 11.  The 11 was really solid and I debrided this under direct fluoroscopic guidance.  I then trialed a 36 + 1.5 head followed by 36 - 2 and 36 - 2 actually gave him more appropriate  leg length.  I then removed all trial components and placed the real HA coated femoral component, size 11 from Corail, which is a DePuy product and a real 36 - 2 metal hip ball and reduced this back in the acetabulum.  He was stable to internal and external rotation, flexion- extension, and had minimal shuck.  His leg lengths were measured to be almost equal.  I then copiously irrigated the soft tissue and closed the joint capsule with interrupted #1 Ethibond suture.  I reapproximated the tensor fascia lata with a running #1 Vicryl followed by a 2-0 Vicryl in subcutaneous tissue and staples on the skin.  Xeroform followed by well- padded sterile dressing was applied.  He was awakened, extubated, and taken to recovery room in stable condition.  All final counts were correct.  There were no complications noted.     Vanita Panda. Magnus Ivan, M.D.     CYB/MEDQ  D:  07/03/2011  T:  07/05/2011  Job:  [id number]

## 2011-07-05 NOTE — Progress Notes (Signed)
PT Cancellation Note Pt has been up OOB all day, walking in the room.  He was having OT this am upon attempt to see for PT. This pm, he is back in bed and does not want to get up again.  He states he has no problem with walking and is ready to go home tomorrow. Ebony Hail, PT

## 2011-07-05 NOTE — Progress Notes (Signed)
Occupational Therapy Evaluation Patient Details Name: Christopher Turner MRN: [id number] DOB: 31-May-1946 Today's Date: 07/05/2011  Problem List:  Patient Active Problem List  Diagnoses  . Degenerative arthritis of hip    Past Medical History:  Past Medical History  Diagnosis Date  . COPD (chronic obstructive pulmonary disease)     23 yrs ago dx wtih COPD no problems now   . Arthritis     arthritis right hip    Past Surgical History:  Past Surgical History  Procedure Date  . Other surgical history     trapped nerve surgery right scapula as teenager   . Other surgical history     vesicocele     OT Assessment/Plan/Recommendation OT Assessment Clinical Impression Statement: Pt presents to OT s/p RTHA (anterior approach). No further OT needed for this pt. Education complete.  Wife will Assist as needed OT Recommendation/Assessment: Patient does not need any further OT services OT Recommendation Follow Up Recommendations: No OT follow up Equipment Recommended: None recommended by OT     OT Evaluation Precautions/Restrictions  Precautions Precautions: Anterior Hip Precaution Comments: no precautions Required Braces or Orthoses: No Restrictions Weight Bearing Restrictions: No Prior Functioning Home Living Bathroom Shower/Tub: Tub/shower unit;Walk-in shower Bathroom Toilet: Handicapped height Home Adaptive Equipment: Grab bars in shower;Grab bars around toilet Prior Function Comments: pt is Social research officer, 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 [address] ADL ADL Grooming: Performed;Wash/dry hands Where Assessed - Grooming: Standing at sink Upper Body Bathing: Performed Where Assessed - Upper Body Bathing: Sitting, bed;Unsupported Lower Body Bathing: Performed;Minimal assistance Where Assessed - Lower Body Bathing: Sit to stand from bed Upper Body Dressing: Performed;Supervision/safety Where Assessed - Upper Body Dressing: Sitting, bed;Unsupported Lower Body Dressing: Minimal  assistance;Performed Lower Body Dressing Details (indicate cue type and reason): wife wiill assist as needed Where Assessed - Lower Body Dressing: Sit to stand from bed Toilet Transfer: Performed Toilet Transfer Method: Proofreader: Regular height toilet;Grab bars Toileting - Clothing Manipulation: Supervision/safety Where Assessed - Glass blower/designer Manipulation: Standing Toileting - Hygiene: Supervision/safety Where Assessed - Toileting Hygiene: Standing Tub/Shower Transfer: Landscape architect Details (indicate cue type and reason): simulated walk in shower as pt has at home Tub/Shower Transfer Method: Ambulating Ambulation Related to ADLs: pt needed verbal cues to put R leg out in front for sit to stand.   ADL Comments: pt doing well!  wife will assist as needed upon DC    Cognition Cognition Arousal/Alertness: Awake/alert Overall Cognitive Status: Appears within functional limits for tasks assessed Orientation Level: Oriented X4    Extremity Assessment RUE Assessment RUE Assessment: Within Functional Limits LUE Assessment LUE Assessment: Within Functional Limits Mobility  Bed Mobility Supine to Sit: 6: Modified independent (Device/Increase time) Transfers Sit to Stand: 6: Modified independent (Device/Increase time) Stand to Sit: 6: Modified independent (Device/Increase time)    End of Session OT - End of Session Activity Tolerance: Patient tolerated treatment well Patient left: in chair;with call bell in reach General Behavior During Session: [facility] for tasks performed Cognition: [facility] for tasks performed   Sirena Riddle, [place] Kung 07/05/2011, 10:21 AM

## 2011-07-06 LAB — CBC
HCT: 33 % — ABNORMAL LOW (ref 39.0–52.0)
Hemoglobin: 11.3 g/dL — ABNORMAL LOW (ref 13.0–17.0)
MCH: 30.1 pg (ref 26.0–34.0)
MCHC: 34.2 g/dL (ref 30.0–36.0)
MCV: 88 fL (ref 78.0–100.0)
Platelets: 208 10*3/uL (ref 150–400)
RBC: 3.75 MIL/uL — ABNORMAL LOW (ref 4.22–5.81)
RDW: 13.6 % (ref 11.5–15.5)
WBC: 9 10*3/uL (ref 4.0–10.5)

## 2011-07-06 MED ORDER — OXYCODONE-ACETAMINOPHEN 5-325 MG PO TABS: 1.0000 | ORAL_TABLET | ORAL | Status: AC | PRN

## 2011-07-06 MED ORDER — RIVAROXABAN 10 MG PO TABS: 10.0000 mg | ORAL_TABLET | Freq: Every day | ORAL | Status: DC

## 2011-07-06 MED ORDER — METHOCARBAMOL 500 MG PO TABS: 500.0000 mg | ORAL_TABLET | Freq: Four times a day (QID) | ORAL | Status: AC | PRN

## 2011-07-06 NOTE — Progress Notes (Signed)
Physical Therapy Treatment Patient Details Name: LYNDOL VANDERHEIDEN MRN: [id number] DOB: 12-06-1945 Today's Date: 07/06/2011  [phone] G PT Assessment/Plan  PT - Assessment/Plan Comments on Treatment Session: Pt tolerated increased ambulation during am session.  Completed all exercises with cuing.  Ready for D/C today.  PT Plan: Discharge plan remains appropriate PT Frequency: 7X/week Follow Up Recommendations: Home health PT Equipment Recommended: None recommended by OT PT Goals  Acute Rehab PT Goals PT Goal Formulation: With patient Time For Goal Achievement: 4 days Pt will go Supine/Side to Sit: with modified independence PT Goal: Supine/Side to Sit - Progress: Met Pt will go Sit to Stand: with modified independence PT Goal: Sit to Stand - Progress: Met Pt will go Stand to Sit: with modified independence;with upper extremity assist PT Goal: Stand to Sit - Progress: Met Pt will Ambulate: >150 feet;with modified independence;with rolling walker PT Goal: Ambulate - Progress: Partly met Pt will Perform Home Exercise Program: Independently PT Goal: Perform Home Exercise Program - Progress: Partly met  PT Treatment Precautions/Restrictions  Precautions Precautions: Anterior Hip Precaution Comments: no precautions Required Braces or Orthoses: No Restrictions Weight Bearing Restrictions: No Mobility (including Balance) Bed Mobility Bed Mobility: Yes Supine to Sit: 6: Modified independent (Device/Increase time) Supine to Sit Details (indicate cue type and reason): Pt able to assist RLE with UE Transfers Transfers: Yes Sit to Stand: 6: Modified independent (Device/Increase time) Sit to Stand Details (indicate cue type and reason): Pt able to stand properly w/o cuing Stand to Sit: 6: Modified independent (Device/Increase time) Stand to Sit Details: Pt use UE properly, no cues required.  Ambulation/Gait Ambulation/Gait: Yes Ambulation/Gait Assistance: 5: Supervision Ambulation/Gait  Assistance Details (indicate cue type and reason): Required cues for sequencing/technique when making turns, cues for upright posture.  Ambulation Distance (Feet): 200 Feet Assistive device: Rolling walker Gait Pattern: Step-to pattern Gait velocity: [facility]    Exercise  Total Joint Exercises Ankle Circles/Pumps: AROM;Both;5 reps;Seated Quad Sets: AROM;Right;5 reps;Seated Short Arc Quad: AROM;Right;5 reps;Seated Heel Slides: AROM;Right;5 reps;Seated Straight Leg Raises: AAROM;Right;5 reps;Seated End of Session PT - End of Session Activity Tolerance: Patient tolerated treatment well Patient left: in chair;with call bell in reach General Behavior During Session: [facility] for tasks performed Cognition: [facility] for tasks performed  Page, Meribeth Mattes 07/06/2011, 10:00 AM

## 2011-07-06 NOTE — Discharge Summary (Signed)
Patient ID: Christopher Turner MRN: [id number] DOB/AGE: January 03, 1946 66 y.o.  Admit date: 07/03/2011 Discharge date: 07/06/2011  Admission Diagnoses:  Principal Problem:  *Degenerative arthritis of hip   Discharge Diagnoses:  Same  Past Medical History  Diagnosis Date  . COPD (chronic obstructive pulmonary disease)     23 yrs ago dx wtih COPD no problems now   . Arthritis     arthritis right hip     Surgeries: Procedure(s): TOTAL HIP ARTHROPLASTY ANTERIOR APPROACH on 07/03/2011   Consultants:    Discharged Condition: Improved  Hospital Course: Christopher Turner is an 66 y.o. male who was admitted 07/03/2011 for operative treatment ofDegenerative arthritis of hip. Patient has severe unremitting pain that affects sleep, daily activities, and work/hobbies. After pre-op clearance the patient was taken to the operating room on 07/03/2011 and underwent  Procedure(s): TOTAL HIP ARTHROPLASTY ANTERIOR APPROACH.    Patient was given perioperative antibiotics: Anti-infectives     Start     Dose/Rate Route Frequency Ordered Stop   07/03/11 1600   ceFAZolin (ANCEF) IVPB 1 g/50 mL premix        1 g 100 mL/hr over 30 Minutes Intravenous Every 6 hours 07/03/11 1338 07/04/11 0441   07/03/11 0730   ceFAZolin (ANCEF) IVPB 2 g/50 mL premix        2 g 100 mL/hr over 30 Minutes Intravenous  Once 07/03/11 0729 07/03/11 1010           Patient was given sequential compression devices, early ambulation, and chemoprophylaxis to prevent DVT.  Patient benefited maximally from hospital stay and there were no complications.    Recent vital signs: Patient Vitals for the past 24 hrs:  BP Temp Temp src Pulse Resp SpO2  07/06/11 0518 100/63 mmHg 98.1 F (36.7 C) Oral 91  18  91 %  07-18-2011 2142 131/76 mmHg 98.7 F (37.1 C) Oral 91  18  91 %  Jul 18, 2011 1520 120/72 mmHg 98.4 F (36.9 C) Oral 91  18  91 %     Recent laboratory studies:  Basename 07/06/11 0415 July 18, 2011 0522 07/04/11 0610  WBC 9.0 8.9 --    HGB 11.3* 11.4* --  HCT 33.0* 33.9* --  PLT 208 192 --  NA -- -- 137  K -- -- 3.6  CL -- -- 104  CO2 -- -- 28  BUN -- -- 14  CREATININE -- -- 1.16  GLUCOSE -- -- 133*  INR -- -- --  CALCIUM -- -- 8.0*     Discharge Medications:  Current Discharge Medication List    START taking these medications   Details  methocarbamol (ROBAXIN) 500 MG tablet Take 1 tablet (500 mg total) by mouth every 6 (six) hours as needed. Qty: 60 tablet, Refills: 0    oxyCODONE-acetaminophen (ROXICET) 5-325 MG per tablet Take 1 tablet by mouth every 4 (four) hours as needed for pain. Qty: 60 tablet, Refills: 0    rivaroxaban (XARELTO) 10 MG TABS tablet Take 1 tablet (10 mg total) by mouth daily with breakfast. Qty: 20 tablet, Refills: 0      STOP taking these medications     ibuprofen (ADVIL,MOTRIN) 200 MG tablet      traMADol (ULTRAM) 50 MG tablet      naproxen sodium (ANAPROX) 220 MG tablet         Diagnostic Studies: Dg Chest 2 View  06/26/2011  *RADIOLOGY REPORT*  Clinical Data: Osteoarthritis of the right hip.  Preoperative respiratory exam.  CHEST -  2 VIEW  Comparison: 09/04/2004  Findings: The heart size and vascularity are normal and the lungs are clear.  No significant osseous abnormality.  IMPRESSION: No acute disease in the chest.  Original Report Authenticated By: Gwynn Burly, M.D.   Dg Hip Complete Right  07/03/2011  *RADIOLOGY REPORT*  Clinical Data: Hip pain  RIGHT HIP - COMPLETE 2+ VIEW  Comparison: None.  Findings: C-arm films document satisfactory placement of right total hip arthroplasty.  IMPRESSION: No adverse features.  Original Report Authenticated By: Elsie Stain, M.D.   Dg Pelvis Portable  07/03/2011  *RADIOLOGY REPORT*  Clinical Data: Postop.  Postop right hip.  PORTABLE PELVIS  Comparison: None.  Findings: AP view of the pelvis shows right hip arthroplasty. Surgical clips overlie the right hip.  Surgical postoperative gas is identified in the region joint.  No  evidence for acute fracture, subluxation.  Regional bowel gas pattern is nonobstructive. Degenerative changes are seen in the lower spine and left hip. Multiple pelvic phleboliths are present.  IMPRESSION: Status post right hip arthroplasty.  No adverse features.  Original Report Authenticated By: Patterson Hammersmith, M.D.   Dg Hip Portable 1 View Right  07/03/2011  *RADIOLOGY REPORT*  Clinical Data: Postop for right hip arthroplasty.  PORTABLE RIGHT HIP - 1 VIEW  Comparison: 07/03/2011 at 1240 hours.  Findings: 2 lateral views.  These demonstrate a right hip arthroplasty, without acute complication or periprosthetic fracture.  Lucency within the cortex of the posterior aspect proximal femur is favored to represent a nutrient foramen.  IMPRESSION: Expected appearance on lateral view of right hip arthroplasty. Probable nutrient foramen within the proximal femur, at the level of the distal portion of the femoral component.  Original Report Authenticated By: Consuello Bossier, M.D.   Dg C-arm 50-120 Min-no Report  07/03/2011  CLINICAL DATA: right hip arthroplasty   C-ARM 61-120 MINUTES  Fluoroscopy was utilized by the requesting physician.  No radiographic  interpretation.      Disposition: Final discharge disposition not confirmed    Follow-up Information    Follow up with [facility] . (Home Physical Therapy)    Contact information:   [phone]          Signed: Kathryne Hitch 07/06/2011, 7:01 AM

## 2011-07-06 NOTE — Progress Notes (Signed)
Discharge summary sent to payer through [facility]  

## 2011-07-06 NOTE — Progress Notes (Signed)
  CARE MANAGEMENT NOTE 07/06/2011  Patient:  Christopher Turner, Christopher Turner   Account Number:  [id number]  Date Initiated:  07/06/2011  Documentation initiated by:  Colleen Can  Subjective/Objective Assessment:   DX RT HIP OSTEOARTHRITIS , DEGENERATIVE JOINT DISEASE; TOTAL HIP REPLACEMNT -ANTERIOR APPROACH     Action/Plan:   Pt plans to go back to his home in [place], [place] where his spouse will be caregiver. He is requesting [facility] for HHPT. No dme needs   Anticipated DC Date:  07/06/2011   Anticipated DC Plan:  HOME W HOME HEALTH SERVICES      DC Planning Services  CM consult      [facility] Choice  HOME HEALTH   Choice offered to / List presented to:  C-1 Patient        HH arranged  HH-2 PT      [facility] agency  [facility]   Status of service:  Completed, signed off Medicare Important Message given?  NA - LOS <3 / Initial given by admissions (If response is "NO", the following Medicare IM given date fields will be blank) Date Medicare IM given:   Date Additional Medicare IM given:    Discharge Disposition:  HOME W HOME HEALTH SERVICES  Per UR Regulation:    Comments:  07/06/2011 [name] BSN CCM [phone] Copyof hh agencies given to patient and copy placed in shadow chart. HH services to start day after discharge.

## 2011-07-07 ENCOUNTER — Encounter (HOSPITAL_COMMUNITY): Payer: Self-pay | Admitting: Orthopaedic Surgery

## 2014-03-14 ENCOUNTER — Encounter: Payer: Self-pay | Admitting: Gastroenterology

## 2014-03-22 ENCOUNTER — Ambulatory Visit (AMBULATORY_SURGERY_CENTER): Payer: Self-pay | Admitting: *Deleted

## 2014-03-22 VITALS — Ht 72.0 in | Wt 208.6 lb

## 2014-03-22 DIAGNOSIS — Z1211 Encounter for screening for malignant neoplasm of colon: Secondary | ICD-10-CM

## 2014-03-22 MED ORDER — MOVIPREP 100 G PO SOLR: ORAL | Status: DC

## 2014-03-22 NOTE — Progress Notes (Signed)
No allergies to eggs or soy. No problems with anesthesia.  Pt given Emmi instructions for colonoscopy  No oxygen use  No diet drug use  

## 2014-04-06 ENCOUNTER — Encounter: Payer: Medicare Other | Admitting: Gastroenterology

## 2018-02-09 ENCOUNTER — Other Ambulatory Visit: Payer: Self-pay | Admitting: Family Medicine

## 2018-02-09 DIAGNOSIS — Z136 Encounter for screening for cardiovascular disorders: Secondary | ICD-10-CM

## 2018-02-14 ENCOUNTER — Ambulatory Visit
Admission: RE | Admit: 2018-02-14 | Discharge: 2018-02-14 | Disposition: A | Discharge status: Home / Self Care | Payer: Medicare Other | Source: Ambulatory Visit | Attending: Family Medicine | Admitting: Family Medicine

## 2018-02-14 DIAGNOSIS — Z136 Encounter for screening for cardiovascular disorders: Secondary | ICD-10-CM

## 2018-02-18 ENCOUNTER — Telehealth: Payer: Self-pay

## 2018-02-18 ENCOUNTER — Other Ambulatory Visit: Payer: Self-pay | Admitting: Family Medicine

## 2018-02-18 DIAGNOSIS — N6320 Unspecified lump in the left breast, unspecified quadrant: Secondary | ICD-10-CM

## 2018-02-18 NOTE — Telephone Encounter (Signed)
Notes on file.  

## 2018-02-24 ENCOUNTER — Ambulatory Visit
Admission: RE | Admit: 2018-02-24 | Discharge: 2018-02-24 | Disposition: A | Discharge status: Home / Self Care | Payer: Medicare Other | Source: Ambulatory Visit | Attending: Family Medicine | Admitting: Family Medicine

## 2018-02-24 ENCOUNTER — Ambulatory Visit: Payer: Medicare Other

## 2018-02-24 DIAGNOSIS — N6320 Unspecified lump in the left breast, unspecified quadrant: Secondary | ICD-10-CM

## 2018-03-18 ENCOUNTER — Encounter: Payer: Self-pay | Admitting: Internal Medicine

## 2018-03-18 ENCOUNTER — Ambulatory Visit: Payer: Medicare Other | Admitting: Internal Medicine

## 2018-03-18 VITALS — BP 132/80 | HR 63 | Ht 72.0 in | Wt 202.1 lb

## 2018-03-18 DIAGNOSIS — R0600 Dyspnea, unspecified: Secondary | ICD-10-CM

## 2018-03-18 DIAGNOSIS — E782 Mixed hyperlipidemia: Secondary | ICD-10-CM

## 2018-03-18 NOTE — Progress Notes (Signed)
Cardiology Office Note   Date:  03/18/2018   ID:  Christopher, Turner August 12, 1945, MRN [id number]  PCP:  Tracey Harries, MD  Cardiologist:   Dietrich Pates, MD    Pt referred for SOB by    History of Present Illness: Christopher Turner is a 72 y.o. male with a history of DM and HL    Pt noticed this summer developed SOB when heat started (June) then got worse  Couldn't mow hill   Bought riding mower Seen in medicine clinic   Told to lay off acitvity until seen ni cards Labs drawn at that visit  Hgn 8.8    Given iron     Since then he says he feels great  Walking   Mowed lawn with push mower last night  No problems   No CP   No dizziness    Has colonoscopy scheduled next week        Current Meds  Medication Sig  . atorvastatin (LIPITOR) 10 MG tablet Take 10 mg by mouth daily.  . metFORMIN (GLUCOPHAGE) 500 MG tablet Take 500 mg by mouth 2 (two) times daily with a meal.     Allergies:   Sulfa antibiotics   Past Medical History:  Diagnosis Date  . Arthritis    arthritis right hip   . COPD (chronic obstructive pulmonary disease) (HCC)    23 yrs ago dx wtih COPD no problems now   . DM type 2 causing renal disease (HCC)   . Elevated LDL cholesterol level   . GERD (gastroesophageal reflux disease)   . OAB (overactive bladder)     Past Surgical History:  Procedure Laterality Date  . CATARACT EXTRACTION Bilateral 2008, 2010  . OTHER SURGICAL HISTORY     trapped nerve surgery right scapula as teenager   . OTHER SURGICAL HISTORY     vesicocele   . TOTAL HIP ARTHROPLASTY  07/03/2011   Procedure: TOTAL HIP ARTHROPLASTY ANTERIOR APPROACH;  Surgeon: Kathryne Hitch;  Location: [facility];  Service: Orthopedics;  Laterality: Right;     Social History:  The patient  reports that he quit smoking about 29 years ago. He has never used smokeless tobacco. He reports that he does not drink alcohol or use drugs.   Family History:  The patient's family history includes CVA in his brother,  maternal grandfather, paternal grandfather, paternal grandmother, and sister; Cancer - Lung in his father; Diabetes Mellitus II in his maternal grandmother, mother, and sister; Hypertension in his sister.    ROS:  Please see the history of present illness. All other systems are reviewed and  Negative to the above problem except as noted.    PHYSICAL EXAM: VS:  BP 132/80   Pulse 63   Ht 6' (1.829 m)   Wt 202 lb 1.9 oz (91.7 kg)   SpO2 98%   BMI 27.41 kg/m   GEN: Well nourished, well developed, in no acute distress  HEENT: normal  Neck: no JVD, carotid bruits, or masses Cardiac: RRR; no murmurs, rubs, or gallops,no edema  Respiratory:  clear to auscultation bilaterally, normal work of breathing GI: soft, nontender, nondistended, + BS  No hepatomegaly  MS: no deformity Moving all extremities   Skin: warm and dry, no rash Neuro:  Strength and sensation are intact Psych: euthymic mood, full affect   EKG:  EKG is ordered today.  SR 60 bpm     Lipid Panel No results found for: CHOL, TRIG, HDL, CHOLHDL,  VLDL, LDLCALC, LDLDIRECT    Wt Readings from Last 3 Encounters:  03/18/18 202 lb 1.9 oz (91.7 kg)  03/22/14 208 lb 9.6 oz (94.6 kg)  07/03/11 179 lb (81.2 kg)      ASSESSMENT AND PLAN:  1  Dyspnea  Probably related to anemia   Pt denies now that received iron    I do not think there is any active ischemia    Continue acitvities as tolerated    Colonoscopy planned  2  DM  On metformin  3  HL   Lipdis excellent in Aug   LD L47   HLDL 43   Continue  Stay active   Exercise From a cardiac standpoint he is at low risk for any procedure planned  F/U if symptoms change     Current medicines are reviewed at length with the patient today.  The patient does not have concerns regarding medicines.  Signed, Dietrich Pates, MD  03/18/2018 11:55 AM    [facility] [address] [place], [place], [place]  [address] Phone: [phone]; Fax: [phone]

## 2018-03-18 NOTE — Patient Instructions (Signed)
Medication Instructions: Your physician recommends that you continue on your current medications as directed. Please refer to the Current Medication list given to you today.   Labwork:None ordered   Testing/Procedures: None ordered   Follow-Up: None needed     Any Other Special Instructions Will Be Listed Below (If Applicable).     If you need a refill on your cardiac medications before your next appointment, please call your pharmacy.

## 2018-03-21 NOTE — Addendum Note (Signed)
Addended by: Madalyn Rob A on: 03/21/2018 01:12 PM   Modules accepted: Orders

## 2018-08-17 ENCOUNTER — Other Ambulatory Visit: Payer: Self-pay | Admitting: Family Medicine

## 2018-08-17 ENCOUNTER — Ambulatory Visit
Admission: RE | Admit: 2018-08-17 | Discharge: 2018-08-17 | Disposition: A | Discharge status: Home / Self Care | Payer: Medicare Other | Source: Ambulatory Visit | Attending: Family Medicine | Admitting: Family Medicine

## 2018-08-17 DIAGNOSIS — R519 Headache, unspecified: Secondary | ICD-10-CM

## 2018-08-17 DIAGNOSIS — R51 Headache: Principal | ICD-10-CM

## 2020-03-21 IMAGING — US ULTRASOUND LEFT BREAST LIMITED
1 series · 7 of 7 positions shown · non-contrast
Comparison: Previous exam(s).

ACR Breast Density Category a: The breast tissue is almost entirely
fatty.

CLINICAL DATA: Palpable lump in the medial left breast.

EXAM:
DIGITAL DIAGNOSTIC BILATERAL MAMMOGRAM WITH CAD AND TOMO
ULTRASOUND LEFT BREAST

[Series 1: ultrasound left breast limited · 0.06mm/px · 7 of 7 slices shown]
[im 1/7]
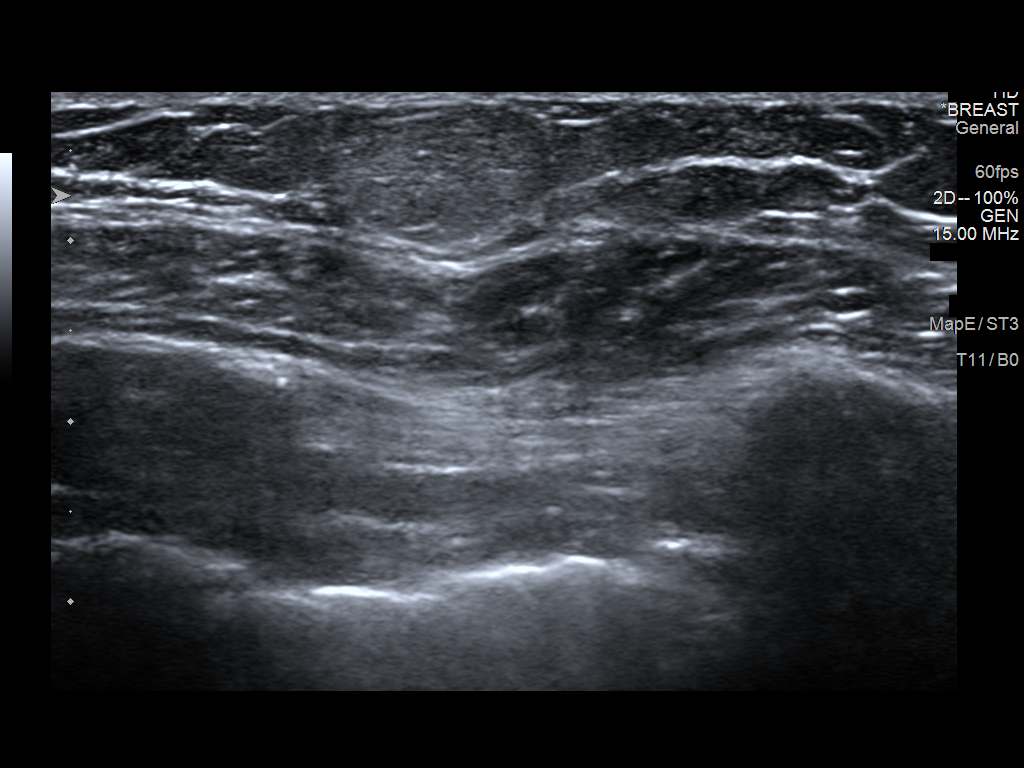
[im 2/7]
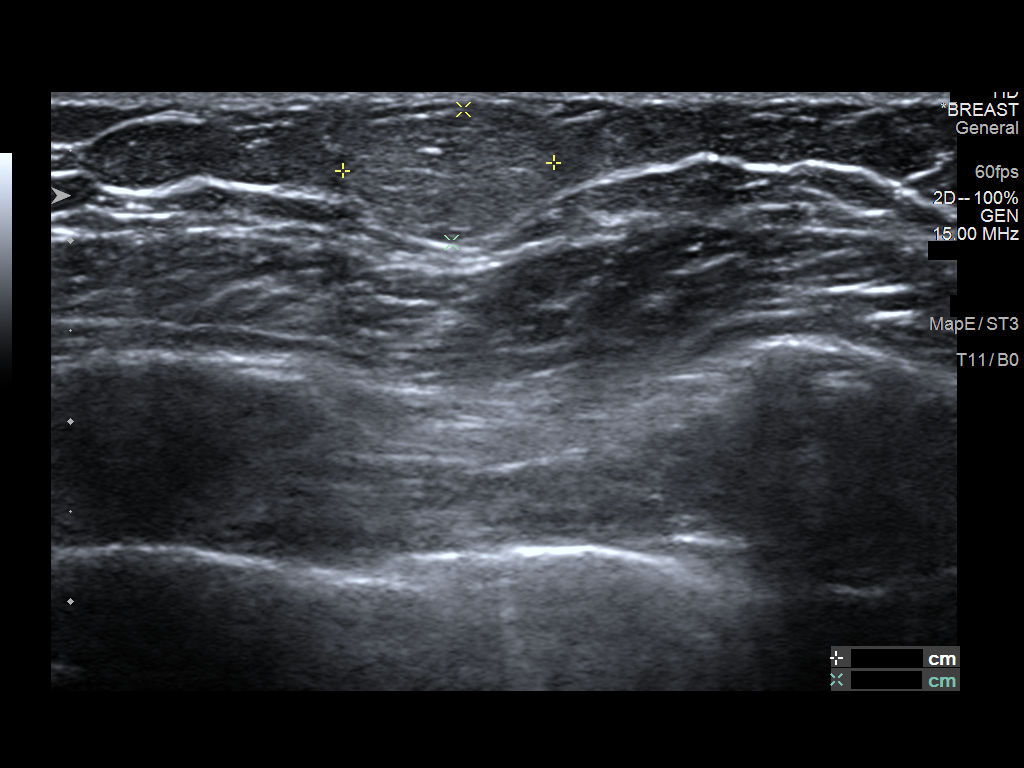
[im 3/7]
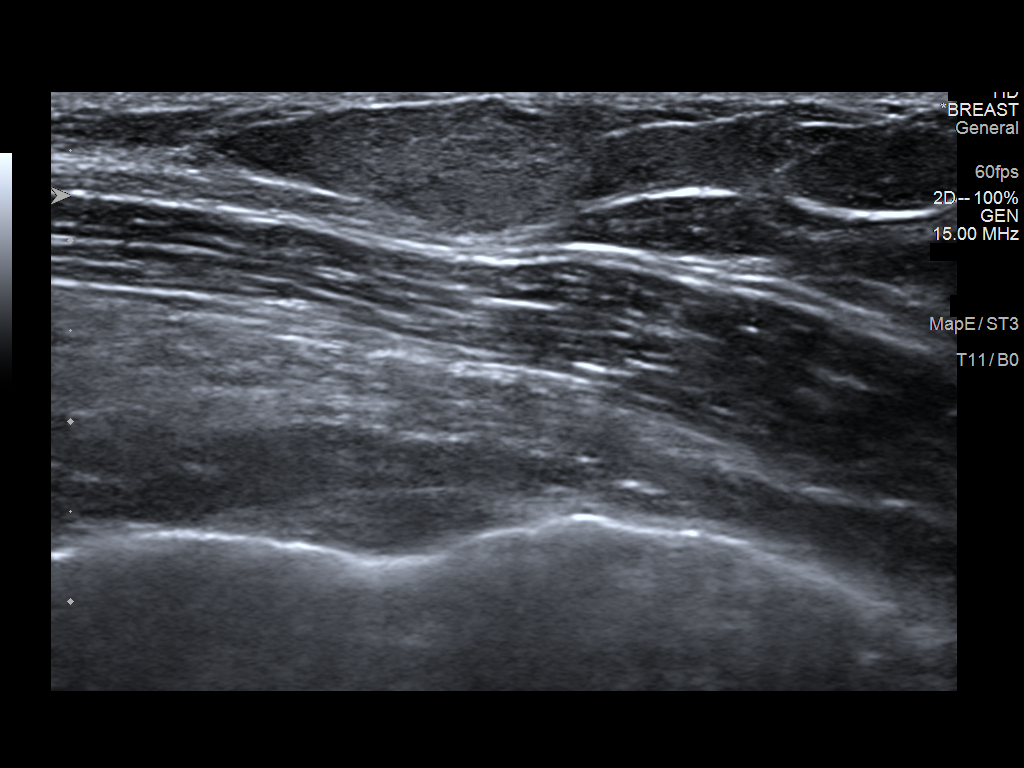
[im 4/7]
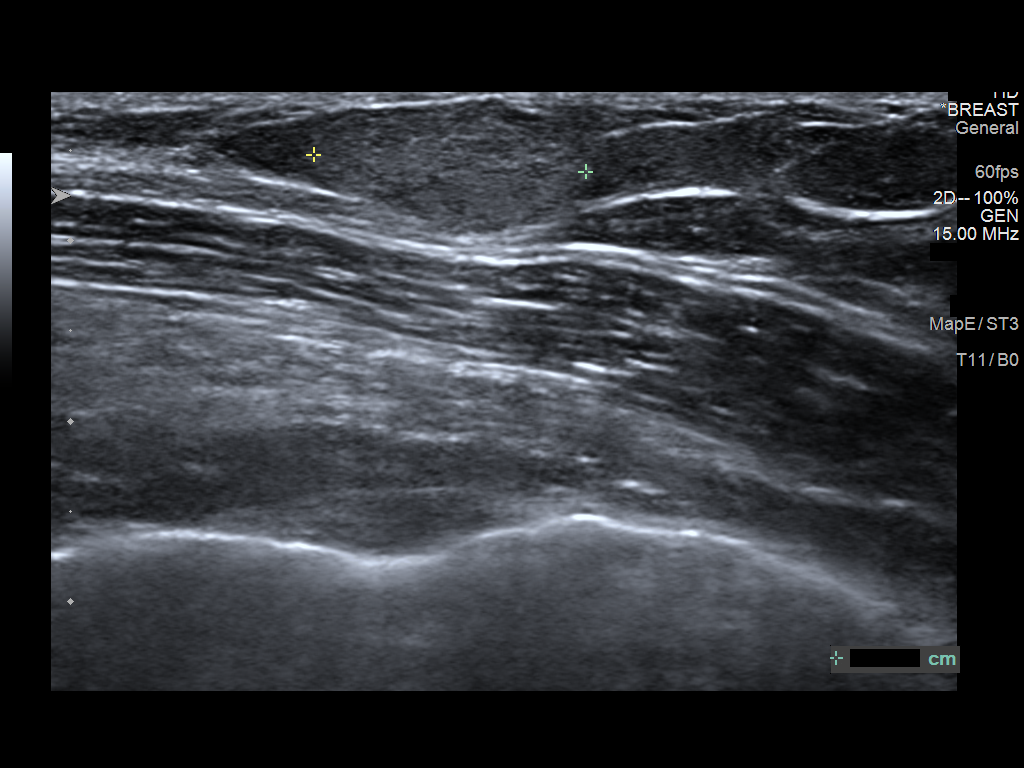
[im 5/7]
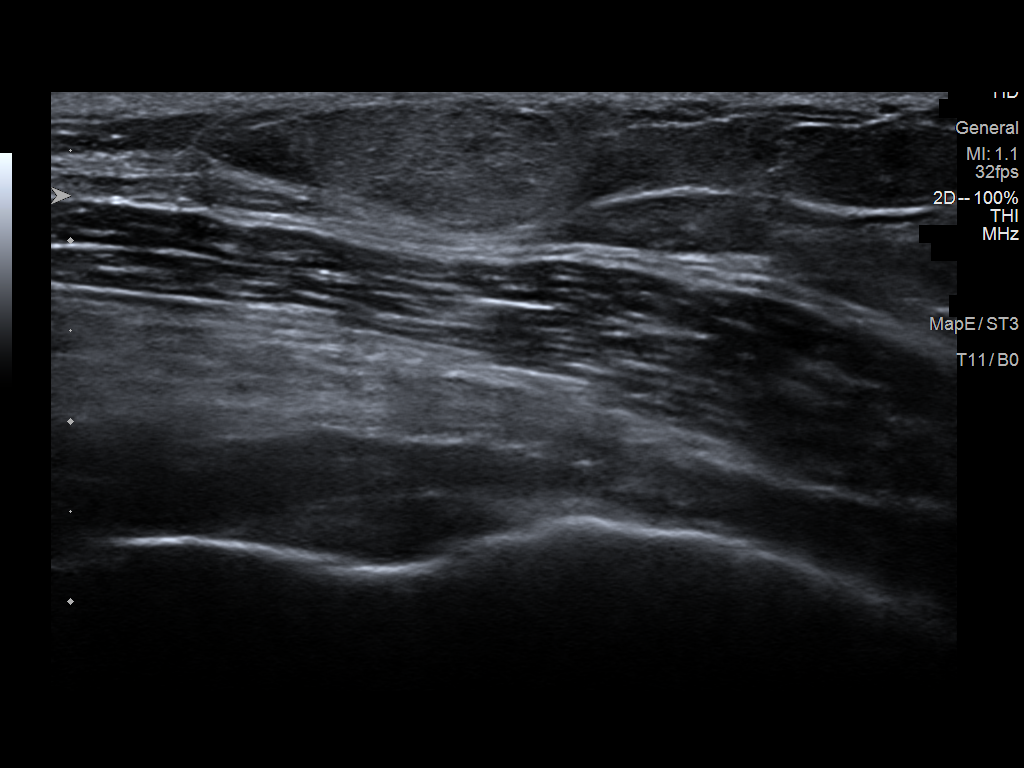
[im 6/7]
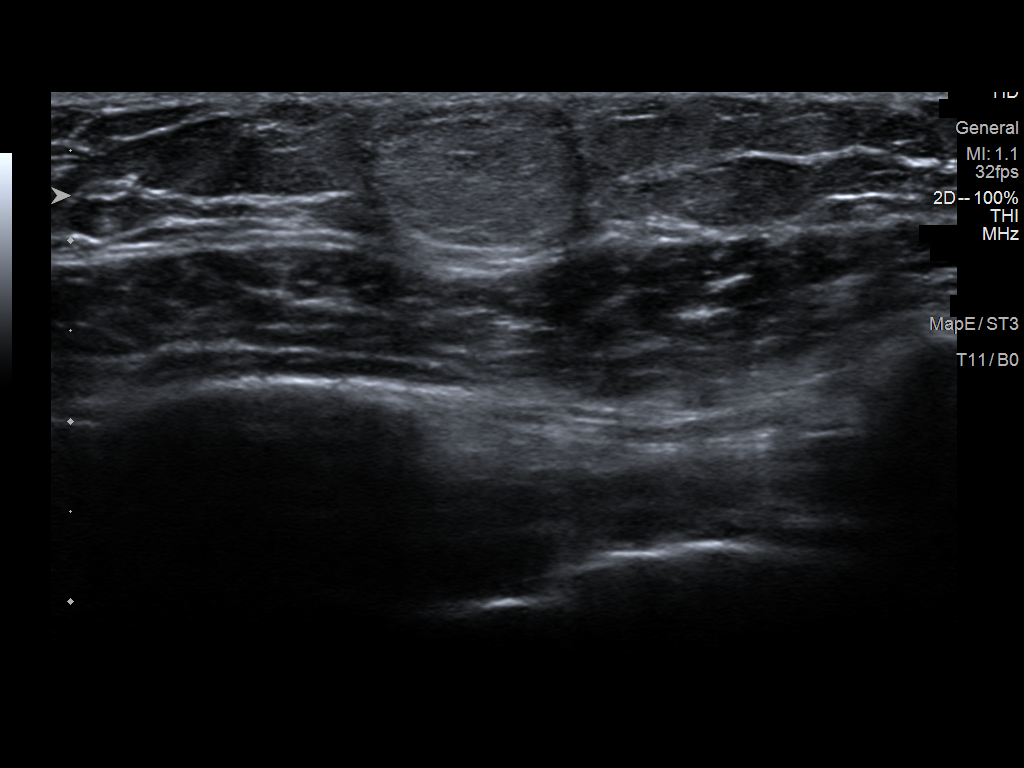
[im 7/7]
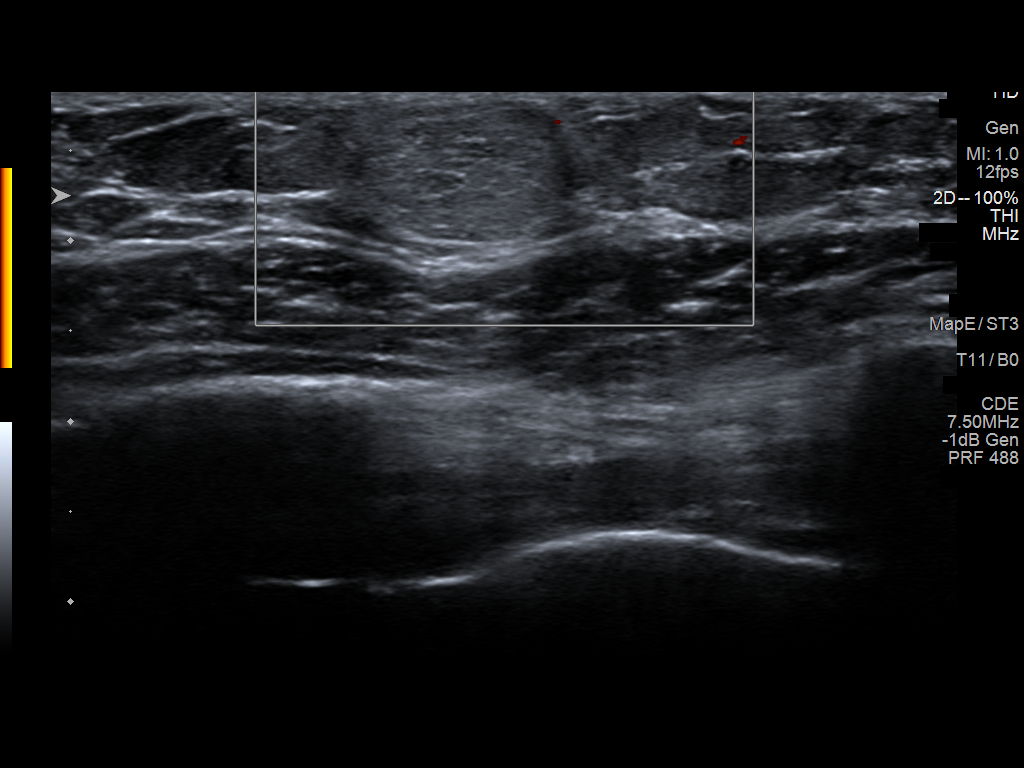

[7 of 7 positions shown; findings below may reference images not displayed]

FINDINGS: An asymmetry in the medial left breast resolves on additional
imaging. No suspicious masses, calcifications, or distortion in
either breast.

Mammographic images were processed with CAD.

On physical exam, a palpable lump is felt at 9 o'clock, 6 cm from
the nipple.

Targeted ultrasound is performed, showing a lipoma measuring 12 x 7
x 15 mm.
IMPRESSION: The patient is palpating a lipoma.

RECOMMENDATION:
No follow-up imaging is necessary.

I have discussed the findings and recommendations with the patient.
Results were also provided in writing at the conclusion of the
visit. If applicable, a reminder letter will be sent to the patient
regarding the next appointment.

BI-RADS CATEGORY  2: Benign.

## 2020-07-05 DIAGNOSIS — Z20822 Contact with and (suspected) exposure to covid-19: Secondary | ICD-10-CM | POA: Diagnosis not present

## 2020-07-05 DIAGNOSIS — R051 Acute cough: Secondary | ICD-10-CM | POA: Diagnosis not present

## 2020-07-18 DIAGNOSIS — E1122 Type 2 diabetes mellitus with diabetic chronic kidney disease: Secondary | ICD-10-CM | POA: Diagnosis not present

## 2020-07-18 DIAGNOSIS — Z7984 Long term (current) use of oral hypoglycemic drugs: Secondary | ICD-10-CM | POA: Diagnosis not present

## 2020-07-18 DIAGNOSIS — J449 Chronic obstructive pulmonary disease, unspecified: Secondary | ICD-10-CM | POA: Diagnosis not present

## 2020-07-18 DIAGNOSIS — N1831 Chronic kidney disease, stage 3a: Secondary | ICD-10-CM | POA: Diagnosis not present

## 2020-07-18 DIAGNOSIS — Z1389 Encounter for screening for other disorder: Secondary | ICD-10-CM | POA: Diagnosis not present

## 2020-07-18 DIAGNOSIS — E7800 Pure hypercholesterolemia, unspecified: Secondary | ICD-10-CM | POA: Diagnosis not present

## 2020-07-18 DIAGNOSIS — N3281 Overactive bladder: Secondary | ICD-10-CM | POA: Diagnosis not present

## 2020-07-18 DIAGNOSIS — R339 Retention of urine, unspecified: Secondary | ICD-10-CM | POA: Diagnosis not present

## 2020-07-18 DIAGNOSIS — B359 Dermatophytosis, unspecified: Secondary | ICD-10-CM | POA: Diagnosis not present

## 2020-07-18 DIAGNOSIS — D509 Iron deficiency anemia, unspecified: Secondary | ICD-10-CM | POA: Diagnosis not present

## 2020-07-18 DIAGNOSIS — Z0000 Encounter for general adult medical examination without abnormal findings: Secondary | ICD-10-CM | POA: Diagnosis not present

## 2020-09-16 ENCOUNTER — Other Ambulatory Visit: Payer: Self-pay | Admitting: Urology

## 2020-09-17 NOTE — Progress Notes (Addendum)
COVID Vaccine Completed: x2 Date COVID Vaccine completed:  08-08-19 09-05-19 Has received booster: COVID vaccine manufacturer:     Moderna     Date of COVID positive in last 90 days: N/A  PCP - Tally Joe, MD Cardiologist - Dietrich Pates, MD.   Chest x-ray -  EKG - 09-19-20 Epic Stress Test -  ECHO -  Cardiac Cath -  Pacemaker/ICD device last checked: Spinal Cord Stimulator:  Sleep Study - N/A CPAP -   Fasting Blood Sugar - 98 to 157 Checks Blood Sugar - one time a day  Blood Thinner Instructions:  N/A Aspirin Instructions: Last Dose:  Activity level:  Can go up a flight of stairs and perform activities of daily living without stopping and without symptoms of chest pain or shortness of breath.  Able to exercise without symptoms  Anesthesia review: COPD, eval by cardiology for dyspnea in 2019.  Pt states that no issues with dyspnea currently  Patient denies shortness of breath, fever, cough and chest pain at PAT appointment   Patient verbalized understanding of instructions that were given to them at the PAT appointment. Patient was also instructed that they will need to review over the PAT instructions again at home before surgery.

## 2020-09-17 NOTE — Patient Instructions (Addendum)
DUE TO COVID-19 ONLY ONE VISITOR IS ALLOWED TO COME WITH YOU AND STAY IN THE WAITING ROOM ONLY DURING PRE OP AND PROCEDURE.     COVID SWAB TESTING MUST BE COMPLETED ON:  Friday, 09-20-20 @ 8:30 AM   [address] [place], [place] [address]  (Must self quarantine after testing. Follow instructions on handout.)        Your procedure is scheduled on: Tuesday, 09-24-20   Report to [facility] Main  Entrance    Report to admitting at 7:30 AM   Call this number if you have problems the morning of surgery [phone]   Do not eat food :After Midnight.   May have liquids until 6:30 AM  day of surgery  CLEAR LIQUID DIET  Foods Allowed                                                                     Foods Excluded  Water, Black Coffee and tea, regular and decaf             liquids that you cannot  Plain Jell-O in any flavor  (No red)                                    see through such as: Fruit ices (not with fruit pulp)                                      milk, soups, orange juice              Iced Popsicles (No red)                                      All solid food                                   Apple juices Sports drinks like Gatorade (No red) Lightly seasoned clear broth or consume(fat free) Sugar, honey syrup                                      Oral Hygiene is also important to reduce your risk of infection.                                    Remember - BRUSH YOUR TEETH THE MORNING OF SURGERY WITH YOUR REGULAR TOOTHPASTE   Do NOT smoke after Midnight   Take these medicines the morning of surgery with A SIP OF WATER:  Atorvastatin  How to Manage Your Diabetes Before and After Surgery  Why is it important to control my blood sugar before and after surgery? . Improving blood sugar levels before and after surgery helps healing and can limit problems. . A way of improving blood sugar control is  eating a healthy diet by: o  Eating less sugar and carbohydrates o   Increasing activity/exercise o  Talking with your doctor about reaching your blood sugar goals . High blood sugars (greater than 180 mg/dL) can raise your risk of infections and slow your recovery, so you will need to focus on controlling your diabetes during the weeks before surgery. . Make sure that the doctor who takes care of your diabetes knows about your planned surgery including the date and location.  How do I manage my blood sugar before surgery? . Check your blood sugar at least 4 times a day, starting 2 days before surgery, to make sure that the level is not too high or low. o Check your blood sugar the morning of your surgery when you wake up and every 2 hours until you get to the Short Stay unit. . If your blood sugar is less than 70 mg/dL, you will need to treat for low blood sugar: o Do not take insulin. o Treat a low blood sugar (less than 70 mg/dL) with  cup of clear juice (cranberry or apple), 4 glucose tablets, OR glucose gel. o Recheck blood sugar in 15 minutes after treatment (to make sure it is greater than 70 mg/dL). If your blood sugar is not greater than 70 mg/dL on recheck, call [phone] for further instructions. . Report your blood sugar to the short stay nurse when you get to Short Stay.  . If you are admitted to the hospital after surgery: o Your blood sugar will be checked by the staff and you will probably be given insulin after surgery (instead of oral diabetes medicines) to make sure you have good blood sugar levels. o The goal for blood sugar control after surgery is 80-180 mg/dL.   WHAT DO I DO ABOUT MY DIABETES MEDICATION?  Marland Kitchen Do not take oral diabetes medicines (pills) the morning of surgery.  . THE DAY BEFORE SURGERY:  Do not take Jardiance.       . THE MORNING OF SURGERY: Do not take Jardiance.  Reviewed and Endorsed by [facility] Patient Education Committee, August 2015                               You may not have any metal on your body  including  jewelry, and body piercings             Do not wear  lotions, powders, perfumes/cologne, or deodorant             Men may shave face and neck.   Do not bring valuables to the hospital. [facility] IS NOT             RESPONSIBLE   FOR VALUABLES.   Contacts, dentures or bridgework may not be worn into surgery.    Patients discharged the day of surgery will not be allowed to drive home.                Please read over the following fact sheets you were given: IF YOU HAVE QUESTIONS ABOUT YOUR PRE OP INSTRUCTIONS PLEASE CALL  [phone] [facility] - Preparing for Surgery Before surgery, you can play an important role.  Because skin is not sterile, your skin needs to be as free of germs as possible.  You can reduce the number of germs on your skin by washing with CHG (chlorahexidine gluconate) soap before surgery.  CHG is an antiseptic cleaner which kills germs and bonds with the skin to continue killing germs even after washing. Please DO NOT use if you have an allergy to CHG or antibacterial soaps.  If your skin becomes reddened/irritated stop using the CHG and inform your nurse when you arrive at Short Stay. Do not shave (including legs and underarms) for at least 48 hours prior to the first CHG shower.  You may shave your face/neck.  Please follow these instructions carefully:  1.  Shower with CHG Soap the night before surgery and the  morning of surgery.  2.  If you choose to wash your hair, wash your hair first as usual with your normal  shampoo.  3.  After you shampoo, rinse your hair and body thoroughly to remove the shampoo.                             4.  Use CHG as you would any other liquid soap.  You can apply chg directly to the skin and wash.  Gently with a scrungie or clean washcloth.  5.  Apply the CHG Soap to your body ONLY FROM THE NECK DOWN.   Do   not use on face/ open                           Wound or open sores. Avoid contact with eyes, ears mouth and    genitals (private parts).                       Wash face,  Genitals (private parts) with your normal soap.             6.  Wash thoroughly, paying special attention to the area where your    surgery  will be performed.  7.  Thoroughly rinse your body with warm water from the neck down.  8.  DO NOT shower/wash with your normal soap after using and rinsing off the CHG Soap.                9.  Pat yourself dry with a clean towel.            10.  Wear clean pajamas.            11.  Place clean sheets on your bed the night of your first shower and do not  sleep with pets. Day of Surgery : Do not apply any lotions/deodorants the morning of surgery.  Please wear clean clothes to the hospital/surgery center.  FAILURE TO FOLLOW THESE INSTRUCTIONS MAY RESULT IN THE CANCELLATION OF YOUR SURGERY  PATIENT SIGNATURE_________________________________  NURSE SIGNATURE__________________________________  ________________________________________________________________________

## 2020-09-19 ENCOUNTER — Encounter (HOSPITAL_COMMUNITY)
Admission: RE | Admit: 2020-09-19 | Discharge: 2020-09-19 | Disposition: A | Discharge status: Home / Self Care | Payer: Medicare Other | Source: Ambulatory Visit | Attending: Urology | Admitting: Urology

## 2020-09-19 ENCOUNTER — Other Ambulatory Visit: Payer: Self-pay

## 2020-09-19 ENCOUNTER — Encounter (HOSPITAL_COMMUNITY): Payer: Self-pay

## 2020-09-19 DIAGNOSIS — Z01818 Encounter for other preprocedural examination: Secondary | ICD-10-CM | POA: Diagnosis not present

## 2020-09-19 HISTORY — DX: Pneumonia, unspecified organism: J18.9

## 2020-09-19 LAB — CBC
HCT: 46.8 % (ref 39.0–52.0)
Hemoglobin: 15.9 g/dL (ref 13.0–17.0)
MCH: 32.6 pg (ref 26.0–34.0)
MCHC: 34 g/dL (ref 30.0–36.0)
MCV: 96.1 fL (ref 80.0–100.0)
Platelets: 226 10*3/uL (ref 150–400)
RBC: 4.87 MIL/uL (ref 4.22–5.81)
RDW: 13.8 % (ref 11.5–15.5)
WBC: 5.9 10*3/uL (ref 4.0–10.5)
nRBC: 0 % (ref 0.0–0.2)

## 2020-09-19 LAB — BASIC METABOLIC PANEL
Anion gap: 7 (ref 5–15)
BUN: 15 mg/dL (ref 8–23)
CO2: 24 mmol/L (ref 22–32)
Calcium: 9.1 mg/dL (ref 8.9–10.3)
Chloride: 107 mmol/L (ref 98–111)
Creatinine, Ser: 1.21 mg/dL (ref 0.61–1.24)
GFR, Estimated: >60 mL/min (ref >60)
Glucose, Bld: 110 mg/dL — ABNORMAL HIGH (ref 70–99)
Potassium: 4.1 mmol/L (ref 3.5–5.1)
Sodium: 138 mmol/L (ref 135–145)

## 2020-09-19 LAB — HEMOGLOBIN A1C
Hgb A1c MFr Bld: 8.1 % — ABNORMAL HIGH (ref 4.8–5.6)
Mean Plasma Glucose: 185.77 mg/dL

## 2020-09-19 LAB — GLUCOSE, CAPILLARY: Glucose-Capillary: 119 mg/dL — ABNORMAL HIGH (ref 70–99)

## 2020-09-20 ENCOUNTER — Other Ambulatory Visit (HOSPITAL_COMMUNITY)
Admission: RE | Admit: 2020-09-20 | Discharge: 2020-09-20 | Disposition: A | Discharge status: Home / Self Care | Payer: Medicare Other | Source: Ambulatory Visit | Attending: Urology | Admitting: Urology

## 2020-09-20 DIAGNOSIS — Z20822 Contact with and (suspected) exposure to covid-19: Secondary | ICD-10-CM | POA: Diagnosis not present

## 2020-09-20 DIAGNOSIS — Z01812 Encounter for preprocedural laboratory examination: Secondary | ICD-10-CM | POA: Insufficient documentation

## 2020-09-20 LAB — SARS CORONAVIRUS 2 (TAT 6-24 HRS): SARS Coronavirus 2: NEGATIVE

## 2020-09-23 NOTE — Anesthesia Preprocedure Evaluation (Addendum)
Anesthesia Evaluation  Patient identified by MRN, date of birth, ID band Patient awake    Reviewed: Allergy & Precautions, NPO status , Patient's Chart, lab work & pertinent test results  Airway Mallampati: II  TM Distance: >3 FB Neck ROM: Full    Dental no notable dental hx. (+) Missing, Edentulous Upper, Poor Dentition,    Pulmonary COPD, former smoker,    Pulmonary exam normal breath sounds clear to auscultation       Cardiovascular Exercise Tolerance: Good negative cardio ROS Normal cardiovascular exam Rhythm:Regular Rate:Normal     Neuro/Psych    GI/Hepatic GERD  Medicated,  Endo/Other  diabetes, Well Controlled, Type 2  Renal/GU      Musculoskeletal   Abdominal   Peds  Hematology Lab Results      Component                Value               Date                      WBC                      5.9                 09/19/2020                HGB                      15.9                09/19/2020                HCT                      46.8                09/19/2020                MCV                      96.1                09/19/2020                PLT                      226                 09/19/2020              Anesthesia Other Findings All; sulfa  Reproductive/Obstetrics                           Anesthesia Physical Anesthesia Plan  ASA: III  Anesthesia Plan: General   Post-op Pain Management:    Induction: Intravenous  PONV Risk Score and Plan: 3 and Treatment may vary due to age or medical condition, Dexamethasone and Ondansetron  Airway Management Planned: LMA  Additional Equipment: None  Intra-op Plan:   Post-operative Plan:   Informed Consent: I have reviewed the patients History and Physical, chart, labs and discussed the procedure including the risks, benefits and alternatives for the proposed anesthesia with the patient or authorized representative who has  indicated his/her understanding and acceptance.     Dental advisory given  Plan Discussed with: CRNA  Anesthesia Plan Comments:        Anesthesia Quick Evaluation

## 2020-09-23 NOTE — H&P (Signed)
Patient is a 75 year old white male seen today for evaluation of penile foreskin lesions. He stated 2 weeks ago began noticing a growth on the outer portion of the foreskin. He is not circumcised. He is in a monogamous relationship has no STD risk factors. Here for evaluation management.     ALLERGIES: Rapaflo CAPS Sulfa Drugs Tamsulosin HCl CAPS    MEDICATIONS: Atorvastatin Calcium  Jardiance     GU PSH: No GU PSH      PSH Notes: Total Hip Replacement, Eye Surgery   NON-GU PSH: Total Hip Replacement - 2015     GU PMH: BPH w/LUTS, Benign localized hyperplasia of prostate with urinary obstruction and lower urinary tract symptoms - 2015 Splitting of urinary stream, Intermittent urinary stream - 2015 Urinary Frequency, Urinary frequency - 2015 Urinary Retention, Unspec, Incomplete bladder emptying - 2015    NON-GU PMH: Encounter for general adult medical examination without abnormal findings, Encounter for preventive health examination - 2015 Personal history of other diseases of the digestive system, History of esophageal reflux - 2015 Diabetes Type 2    FAMILY HISTORY: Deceased - Runs In Family Diabetes - Runs In Family Small cell lung cancer - Runs In Family Strokes - Runs In Family   SOCIAL HISTORY: Marital Status: Widowed Preferred Language: English; Ethnicity: Not Hispanic Or Latino; Race: White Current Smoking Status: Patient has never smoked.   Tobacco Use Assessment Completed: Used Tobacco in last 30 days? Does not use smokeless tobacco. Does not use drugs. Drinks 2 caffeinated drinks per day. Has not had a blood transfusion.     Notes: Former smoker, No alcohol use, Caffeine use, Retired, Married, Number of children, Former smoker   REVIEW OF SYSTEMS:    GU Review Male:   Patient reports frequent urination, get up at night to urinate, and penile pain. Patient denies hard to postpone urination, burning/ pain with urination, leakage of urine, stream starts and  stops, trouble starting your stream, have to strain to urinate , and erection problems.  Gastrointestinal (Upper):   Patient denies nausea, vomiting, and indigestion/ heartburn.  Gastrointestinal (Lower):   Patient denies diarrhea and constipation.  Constitutional:   Patient denies fever, night sweats, weight loss, and fatigue.  Skin:   Patient denies skin rash/ lesion and itching.  Eyes:   Patient denies blurred vision and double vision.  Ears/ Nose/ Throat:   Patient denies sore throat and sinus problems.  Hematologic/Lymphatic:   Patient denies swollen glands and easy bruising.  Cardiovascular:   Patient denies leg swelling and chest pains.  Respiratory:   Patient denies cough and shortness of breath.  Endocrine:   Patient denies excessive thirst.  Musculoskeletal:   Patient denies back pain and joint pain.  Neurological:   Patient denies headaches and dizziness.  Psychologic:   Patient denies depression and anxiety.   VITAL SIGNS:      09/12/2020 08:23 AM  Weight 175 lb / 79.38 kg  Height 73 in / 185.42 cm  Pulse 78 /min  Temperature 97.1 F / 36.1 C  BMI 23.1 kg/m   GU PHYSICAL EXAMINATION:    Scrotum: No lesions. No edema. No cysts. No warts.  Urethral Meatus: Normal size. No lesion, no wart, no discharge, no polyp. Normal location.  Penis: Penis uncircumcised. No foreskin warts, no cracks. No dorsal peyronie's plaques, no left corporal peyronie's plaques, no right corporal peyronie's plaques, no scarring,. No balanitis, no meatal stenosis. On the outer prep use foreskin there are several raised lesions dorsally  consistent with condylomatous HPV. Foreskin does retract easily and there are no glanular or other shaft lesions.   MULTI-SYSTEM PHYSICAL EXAMINATION:    Constitutional: Well-nourished. No physical deformities. Normally developed. Good grooming.  Neck: Neck symmetrical, not swollen. Normal tracheal position.  Respiratory: No labored breathing, no use of accessory muscles.    Cardiovascular: Normal temperature, normal extremity pulses, no swelling, no varicosities.  Lymphatic: No enlargement of neck, axillae, groin.  Skin: No paleness, no jaundice, no cyanosis. No lesion, no ulcer, no rash.  Neurologic / Psychiatric: Oriented to time, oriented to place, oriented to person. No depression, no anxiety, no agitation.  Gastrointestinal: No mass, no tenderness, no rigidity, non obese abdomen.  Eyes: Normal conjunctivae. Normal eyelids.  Ears, Nose, Mouth, and Throat: Left ear no scars, no lesions, no masses. Right ear no scars, no lesions, no masses. Nose no scars, no lesions, no masses. Normal hearing. Normal lips.  Musculoskeletal: Normal gait and station of head and neck.     Complexity of Data:  Source Of History:  Patient  Records Review:   Previous Doctor Records, Previous Hospital Records  Urine Test Review:   Urinalysis   04/11/14  PSA  Total PSA 0.62   Free PSA 0.30   % Free PSA 48     PROCEDURES:          Urinalysis - 81003 Dipstick Dipstick Cont'd  Color: Yellow Bilirubin: Neg mg/dL  Appearance: Clear Ketones: Neg mg/dL  Specific Gravity: 2.694 Blood: Neg ery/uL  pH: <=5.0 Protein: Neg mg/dL  Glucose: 2+ mg/dL Urobilinogen: 0.2 mg/dL    Nitrites: Neg    Leukocyte Esterase: Neg leu/uL    ASSESSMENT:      ICD-10 Details  1 GU:   Genital warts - A63.0 Acute, Complicated Injury   PLAN:           Document Letter(s):  Created for Patient: Clinical Summary         Notes:   I discussed physical exam findings with the patient that appeared to be indicative of HPV viral wart. I explained to him that these typically are STD related but could have been a result of previous exposure history. We talked about treatment options with topical therapy versus laser excision or proceeding with circumcision. These lesions are on the outer prepuce and I think he if we did circumcision this would include in remove all of the ports in a single procedure. After  discussing this he has opted for circumcision. Risks and benefits have procedure discussed as outlined below. He knows to discuss the physical exam findings with partner as potentially she could be affected by these in terms of HPV infection.  Circumcision consent: I have discussed with the patient the risks and benefits of the procedure including but not limited to bleeding, infection, damage to adjacent structures including the urethra wth possible need for further procedures. Risk of numbness and decreased sensation of the penis, scarring of the penile skin and pain associated with scarring. I have also discussed with the patient the alternatives to circumcision. Patient voices understanding of the risks and benefits of the above procedure and consents.

## 2020-09-24 ENCOUNTER — Ambulatory Visit (HOSPITAL_COMMUNITY)
Admission: RE | Admit: 2020-09-24 | Discharge: 2020-09-24 | Disposition: A | Discharge status: Home / Self Care | Payer: Medicare Other | Attending: Urology | Admitting: Urology

## 2020-09-24 ENCOUNTER — Encounter (HOSPITAL_COMMUNITY)
Admission: RE | Disposition: A | Discharge status: Home / Self Care | Payer: Self-pay | Source: Home / Self Care | Attending: Urology

## 2020-09-24 ENCOUNTER — Ambulatory Visit (HOSPITAL_COMMUNITY): Payer: Medicare Other | Admitting: Anesthesiology

## 2020-09-24 ENCOUNTER — Encounter (HOSPITAL_COMMUNITY): Payer: Self-pay | Admitting: Urology

## 2020-09-24 ENCOUNTER — Ambulatory Visit (HOSPITAL_COMMUNITY): Payer: Medicare Other | Admitting: Physician Assistant

## 2020-09-24 DIAGNOSIS — A630 Anogenital (venereal) warts: Secondary | ICD-10-CM | POA: Diagnosis present

## 2020-09-24 DIAGNOSIS — Z8719 Personal history of other diseases of the digestive system: Secondary | ICD-10-CM | POA: Insufficient documentation

## 2020-09-24 DIAGNOSIS — N471 Phimosis: Secondary | ICD-10-CM | POA: Insufficient documentation

## 2020-09-24 DIAGNOSIS — Z87891 Personal history of nicotine dependence: Secondary | ICD-10-CM | POA: Diagnosis not present

## 2020-09-24 DIAGNOSIS — J449 Chronic obstructive pulmonary disease, unspecified: Secondary | ICD-10-CM | POA: Diagnosis not present

## 2020-09-24 DIAGNOSIS — Z96649 Presence of unspecified artificial hip joint: Secondary | ICD-10-CM | POA: Insufficient documentation

## 2020-09-24 DIAGNOSIS — Z888 Allergy status to other drugs, medicaments and biological substances status: Secondary | ICD-10-CM | POA: Diagnosis not present

## 2020-09-24 DIAGNOSIS — Z882 Allergy status to sulfonamides status: Secondary | ICD-10-CM | POA: Diagnosis not present

## 2020-09-24 DIAGNOSIS — E119 Type 2 diabetes mellitus without complications: Secondary | ICD-10-CM | POA: Diagnosis not present

## 2020-09-24 DIAGNOSIS — K219 Gastro-esophageal reflux disease without esophagitis: Secondary | ICD-10-CM | POA: Diagnosis not present

## 2020-09-24 HISTORY — PX: CIRCUMCISION: SHX1350

## 2020-09-24 LAB — GLUCOSE, CAPILLARY
Glucose-Capillary: 142 mg/dL — ABNORMAL HIGH (ref 70–99)
Glucose-Capillary: 130 mg/dL — ABNORMAL HIGH (ref 70–99)

## 2020-09-24 SURGERY — CIRCUMCISION, ADULT
Anesthesia: General | Site: Penis

## 2020-09-24 MED ORDER — FENTANYL CITRATE (PF) 100 MCG/2ML IJ SOLN: 25.0000 ug | INTRAMUSCULAR | Status: DC | PRN

## 2020-09-24 MED ORDER — BUPIVACAINE HCL (PF) 0.25 % IJ SOLN
INTRAMUSCULAR | Status: DC | PRN
  Administered 2020-09-24: 20 mL

## 2020-09-24 MED ORDER — DEXAMETHASONE SODIUM PHOSPHATE 10 MG/ML IJ SOLN
INTRAMUSCULAR | Status: DC | PRN
  Administered 2020-09-24: 8 mg via INTRAVENOUS

## 2020-09-24 MED ORDER — 0.9 % SODIUM CHLORIDE (POUR BTL) OPTIME
TOPICAL | Status: DC | PRN
  Administered 2020-09-24: 1000 mL

## 2020-09-24 MED ORDER — FENTANYL CITRATE (PF) 100 MCG/2ML IJ SOLN
INTRAMUSCULAR | Status: AC
  Filled 2020-09-24: qty 2

## 2020-09-24 MED ORDER — ONDANSETRON HCL 4 MG/2ML IJ SOLN: 4.0000 mg | Freq: Once | INTRAMUSCULAR | Status: DC | PRN

## 2020-09-24 MED ORDER — ACETAMINOPHEN 10 MG/ML IV SOLN: 1000.0000 mg | Freq: Once | INTRAVENOUS | Status: DC | PRN

## 2020-09-24 MED ORDER — FENTANYL CITRATE (PF) 100 MCG/2ML IJ SOLN
INTRAMUSCULAR | Status: DC | PRN
  Administered 2020-09-24: 50 ug via INTRAVENOUS

## 2020-09-24 MED ORDER — EPHEDRINE SULFATE-NACL 50-0.9 MG/10ML-% IV SOSY
PREFILLED_SYRINGE | INTRAVENOUS | Status: DC | PRN
  Administered 2020-09-24: 10 mg via INTRAVENOUS

## 2020-09-24 MED ORDER — ONDANSETRON HCL 4 MG/2ML IJ SOLN
INTRAMUSCULAR | Status: DC | PRN
  Administered 2020-09-24: 4 mg via INTRAVENOUS

## 2020-09-24 MED ORDER — BUPIVACAINE HCL 0.25 % IJ SOLN
INTRAMUSCULAR | Status: AC
  Filled 2020-09-24: qty 1

## 2020-09-24 MED ORDER — LACTATED RINGERS IV SOLN: INTRAVENOUS | Status: DC

## 2020-09-24 MED ORDER — CEFAZOLIN SODIUM-DEXTROSE 2-4 GM/100ML-% IV SOLN
INTRAVENOUS | Status: AC
  Filled 2020-09-24: qty 100

## 2020-09-24 MED ORDER — TRAMADOL HCL 50 MG PO TABS: 50.0000 mg | ORAL_TABLET | Freq: Four times a day (QID) | ORAL | 0 refills | Status: DC | PRN

## 2020-09-24 MED ORDER — CEFAZOLIN SODIUM-DEXTROSE 2-4 GM/100ML-% IV SOLN
2.0000 g | INTRAVENOUS | Status: AC
  Administered 2020-09-24: 2 g via INTRAVENOUS

## 2020-09-24 MED ORDER — BACITRACIN-NEOMYCIN-POLYMYXIN OINTMENT TUBE
TOPICAL_OINTMENT | CUTANEOUS | Status: AC
  Filled 2020-09-24: qty 14.17

## 2020-09-24 MED ORDER — CHLORHEXIDINE GLUCONATE 0.12 % MT SOLN: 15.0000 mL | Freq: Once | OROMUCOSAL | Status: AC

## 2020-09-24 MED ORDER — EPHEDRINE 5 MG/ML INJ
INTRAVENOUS | Status: AC
  Filled 2020-09-24: qty 10

## 2020-09-24 MED ORDER — PHENYLEPHRINE 40 MCG/ML (10ML) SYRINGE FOR IV PUSH (FOR BLOOD PRESSURE SUPPORT)
PREFILLED_SYRINGE | INTRAVENOUS | Status: AC
  Filled 2020-09-24: qty 10

## 2020-09-24 MED ORDER — PROPOFOL 10 MG/ML IV BOLUS
INTRAVENOUS | Status: DC | PRN
  Administered 2020-09-24: 50 mg via INTRAVENOUS
  Administered 2020-09-24: 150 mg via INTRAVENOUS

## 2020-09-24 MED ORDER — LIDOCAINE 2% (20 MG/ML) 5 ML SYRINGE
INTRAMUSCULAR | Status: DC | PRN
  Administered 2020-09-24: 100 mg via INTRAVENOUS

## 2020-09-24 MED ORDER — ORAL CARE MOUTH RINSE
15.0000 mL | Freq: Once | OROMUCOSAL | Status: AC
  Administered 2020-09-24: 15 mL via OROMUCOSAL

## 2020-09-24 SURGICAL SUPPLY — 26 items
BLADE SURG 15 STRL LF DISP TIS (BLADE) ×1 IMPLANT
BLADE SURG 15 STRL SS (BLADE) ×2
BNDG COHESIVE 1X5 TAN STRL LF (GAUZE/BANDAGES/DRESSINGS) ×2 IMPLANT
BNDG CONFORM 2 STRL LF (GAUZE/BANDAGES/DRESSINGS) ×2 IMPLANT
COVER SURGICAL LIGHT HANDLE (MISCELLANEOUS) ×2 IMPLANT
COVER WAND RF STERILE (DRAPES) IMPLANT
DRAPE LAPAROTOMY T 98X78 PEDS (DRAPES) ×2 IMPLANT
ELECT REM PT RETURN 15FT ADLT (MISCELLANEOUS) ×2 IMPLANT
GAUZE PETROLATUM 1 X8 (GAUZE/BANDAGES/DRESSINGS) ×2 IMPLANT
GLOVE SURG ENC TEXT LTX SZ7.5 (GLOVE) ×2 IMPLANT
GOWN STRL REUS W/TWL LRG LVL3 (GOWN DISPOSABLE) ×2 IMPLANT
KIT BASIN OR (CUSTOM PROCEDURE TRAY) ×2 IMPLANT
KIT TURNOVER KIT A (KITS) ×2 IMPLANT
NEEDLE HYPO 22GX1.5 SAFETY (NEEDLE) ×2 IMPLANT
NS IRRIG 1000ML POUR BTL (IV SOLUTION) IMPLANT
PACK BASIC VI WITH GOWN DISP (CUSTOM PROCEDURE TRAY) ×2 IMPLANT
PENCIL SMOKE EVACUATOR (MISCELLANEOUS) IMPLANT
SPONGE LAP 18X18 RF (DISPOSABLE) ×2 IMPLANT
SUT CHROMIC 3 0 PS 2 (SUTURE) IMPLANT
SUT CHROMIC 3 0 SH 27 (SUTURE) ×4 IMPLANT
SUT CHROMIC 4 0 SH 27 (SUTURE) ×2 IMPLANT
SYR CONTROL 10ML LL (SYRINGE) IMPLANT
TOWEL OR 17X26 10 PK STRL BLUE (TOWEL DISPOSABLE) ×2 IMPLANT
TOWEL OR NON WOVEN STRL DISP B (DISPOSABLE) ×2 IMPLANT
TUBING INSUFFLATION 10FT LAP (TUBING) IMPLANT
WATER STERILE IRR 1000ML POUR (IV SOLUTION) IMPLANT

## 2020-09-24 NOTE — Transfer of Care (Signed)
Immediate Anesthesia Transfer of Care Note  Patient: Christopher Turner  Procedure(s) Performed: CIRCUMCISION ADULT (N/A Penis)  Patient Location: PACU  Anesthesia Type:General  Level of Consciousness: awake, alert  and oriented  Airway & Oxygen Therapy: Patient Spontanous Breathing and Patient connected to face mask  Post-op Assessment: Report given to RN and Post -op Vital signs reviewed and stable  Post vital signs: Reviewed and stable  Last Vitals:  Vitals Value Taken Time  BP 115/88 09/24/20 1021  Temp 36.4 C 09/24/20 1021  Pulse 83 09/24/20 1026  Resp 9 09/24/20 1026  SpO2 94 % 09/24/20 1026  Vitals shown include unvalidated device data.  Last Pain:  Vitals:   09/24/20 1021  TempSrc:   PainSc: Asleep         Complications: No complications documented.

## 2020-09-24 NOTE — Anesthesia Procedure Notes (Signed)
Procedure Name: LMA Insertion Performed by: Leomar Westberg A, CRNA Pre-anesthesia Checklist: Patient identified, Emergency Drugs available, Suction available and Patient being monitored Patient Re-evaluated:Patient Re-evaluated prior to induction Oxygen Delivery Method: Circle system utilized Preoxygenation: Pre-oxygenation with 100% oxygen Induction Type: IV induction LMA: LMA inserted LMA Size: 4.0 Tube type: Oral Number of attempts: 1 Placement Confirmation: positive ETCO2 and breath sounds checked- equal and bilateral Tube secured with: Tape Dental Injury: Teeth and Oropharynx as per pre-operative assessment        

## 2020-09-24 NOTE — Interval H&P Note (Signed)
History and Physical Interval Note:  09/24/2020 9:13 AM  Christopher Turner  has presented today for surgery, with the diagnosis of PHIMOSIS, GENITAL WARTS.  The various methods of treatment have been discussed with the patient and family. After consideration of risks, benefits and other options for treatment, the patient has consented to  Procedure(s) with comments: CIRCUMCISION ADULT (N/A) - 45 MINS as a surgical intervention.  The patient's history has been reviewed, patient examined, no change in status, stable for surgery.  I have reviewed the patient's chart and labs.  Questions were answered to the patient's satisfaction.     Belva Agee

## 2020-09-24 NOTE — Anesthesia Postprocedure Evaluation (Signed)
Anesthesia Post Note  Patient: Christopher Turner  Procedure(s) Performed: CIRCUMCISION ADULT (N/A Penis)     Patient location during evaluation: PACU Anesthesia Type: General Level of consciousness: awake and alert Pain management: pain level controlled Vital Signs Assessment: post-procedure vital signs reviewed and stable Respiratory status: spontaneous breathing, nonlabored ventilation, respiratory function stable and patient connected to nasal cannula oxygen Cardiovascular status: blood pressure returned to baseline and stable Postop Assessment: no apparent nausea or vomiting Anesthetic complications: no   No complications documented.  Last Vitals:  Vitals:   09/24/20 1021 09/24/20 1027  BP: 115/88   Pulse: 84 83  Resp: 11 18  Temp: 36.4 C   SpO2: 94% 94%    Last Pain:  Vitals:   09/24/20 1021  TempSrc:   PainSc: Asleep                 Trevor Iha

## 2020-09-24 NOTE — Op Note (Signed)
Preoperative diagnosis:  1.  Penile foreskin HPV  Postoperative diagnosis: 1.  Same  Procedure(s): 1.  Circumcision  Surgeon: Dr. Karoline Caldwell  Anesthesia: General  Complications: None  EBL: Minimal  Specimens: Penile foreskin with associated HPV lesions  Disposition of specimens: Specimen to pathology  Intraoperative findings: Patient had numerous condylomatous lesions involving the distal foreskin.  Circumcision performed excising all of the lesions along with the foreskin.  Indication: Patient is a 75 year old white male new onset HPV condylomatous penile foreskin lesions involving the distal foreskin.  Presents at this time to undergo circumcision for definitive management.  Description of procedure:  After obtaining informed consent for the patient is taken the major or suite placed under general anesthesia.  He is placed in the supine position genitalia prepped and draped in usual sterile fashion.  Proper pause and timeout was performed.  Marking pen was utilized to mark the skin overlying the coronal sulcus circumferentially taking care to include all of the condylomatous lesions in the distal foreskin.  Circumcising incision was then made over this line.  The foreskin was retracted similar circumcising incision was made approximately 2 mm beneath the coronal sulcus circumferentially.  The ring of skin between the 2 incisions then excised sharply in an avascular plane.  Hemostasis was obtained with Bovie cautery.  The shaft foreskin was then reapproximated the skin beneath the coronal sulcus taking care to align the ventral frenular line utilizing 4-0 and 3-0 interrupted chromic sutures.  Again good hemostasis was noted.  Vaseline gauze wrap followed by Kerlix and Coban compression dressing was in place.  Dorsal penile block was performed with half percent plain Marcaine termination of the case for postoperative anesthesia.  He was awakened from anesthesia and taken back to the  recovery room stable condition.  No immediate complication from the procedure.

## 2020-09-25 ENCOUNTER — Encounter (HOSPITAL_COMMUNITY): Payer: Self-pay | Admitting: Urology

## 2020-09-25 LAB — SURGICAL PATHOLOGY

## 2020-11-20 DIAGNOSIS — J441 Chronic obstructive pulmonary disease with (acute) exacerbation: Secondary | ICD-10-CM | POA: Diagnosis not present

## 2020-11-20 DIAGNOSIS — R0902 Hypoxemia: Secondary | ICD-10-CM | POA: Diagnosis not present

## 2020-11-20 DIAGNOSIS — W57XXXA Bitten or stung by nonvenomous insect and other nonvenomous arthropods, initial encounter: Secondary | ICD-10-CM | POA: Diagnosis not present

## 2020-11-20 DIAGNOSIS — S30860A Insect bite (nonvenomous) of lower back and pelvis, initial encounter: Secondary | ICD-10-CM | POA: Diagnosis not present

## 2020-11-20 DIAGNOSIS — J40 Bronchitis, not specified as acute or chronic: Secondary | ICD-10-CM | POA: Diagnosis not present

## 2021-02-03 DIAGNOSIS — N1831 Chronic kidney disease, stage 3a: Secondary | ICD-10-CM | POA: Diagnosis not present

## 2021-02-03 DIAGNOSIS — D509 Iron deficiency anemia, unspecified: Secondary | ICD-10-CM | POA: Diagnosis not present

## 2021-02-03 DIAGNOSIS — E7800 Pure hypercholesterolemia, unspecified: Secondary | ICD-10-CM | POA: Diagnosis not present

## 2021-02-03 DIAGNOSIS — N3281 Overactive bladder: Secondary | ICD-10-CM | POA: Diagnosis not present

## 2021-02-03 DIAGNOSIS — M25551 Pain in right hip: Secondary | ICD-10-CM | POA: Diagnosis not present

## 2021-02-03 DIAGNOSIS — Z7984 Long term (current) use of oral hypoglycemic drugs: Secondary | ICD-10-CM | POA: Diagnosis not present

## 2021-02-03 DIAGNOSIS — J449 Chronic obstructive pulmonary disease, unspecified: Secondary | ICD-10-CM | POA: Diagnosis not present

## 2021-02-03 DIAGNOSIS — E1122 Type 2 diabetes mellitus with diabetic chronic kidney disease: Secondary | ICD-10-CM | POA: Diagnosis not present

## 2021-02-26 ENCOUNTER — Other Ambulatory Visit: Payer: Self-pay | Admitting: Family Medicine

## 2021-02-26 DIAGNOSIS — I714 Abdominal aortic aneurysm, without rupture: Secondary | ICD-10-CM

## 2021-02-26 DIAGNOSIS — I7143 Infrarenal abdominal aortic aneurysm, without rupture: Secondary | ICD-10-CM

## 2021-03-17 ENCOUNTER — Ambulatory Visit: Payer: Self-pay

## 2021-03-17 ENCOUNTER — Other Ambulatory Visit: Payer: Self-pay

## 2021-03-17 ENCOUNTER — Encounter: Payer: Self-pay | Admitting: Orthopaedic Surgery

## 2021-03-17 ENCOUNTER — Ambulatory Visit (INDEPENDENT_AMBULATORY_CARE_PROVIDER_SITE_OTHER): Payer: Medicare Other | Admitting: Orthopaedic Surgery

## 2021-03-17 DIAGNOSIS — M25561 Pain in right knee: Secondary | ICD-10-CM

## 2021-03-17 DIAGNOSIS — M79604 Pain in right leg: Secondary | ICD-10-CM

## 2021-03-17 MED ORDER — MELOXICAM 15 MG PO TABS: 15.0000 mg | ORAL_TABLET | Freq: Every day | ORAL | 0 refills | Status: DC

## 2021-03-17 NOTE — Progress Notes (Signed)
Office Visit Note   Patient: Christopher Turner           Date of Birth: Feb 23, 1946           MRN: [id number] Visit Date: 03/17/2021              Requested by: Tally Joe, MD [phone] [address] [place],  [place] [address] PCP: Tally Joe, MD   Assessment & Plan: Visit Diagnoses:  1. Pain in right leg     Plan: Since he is not having a deep knee pain, I am holding off on a steroid injection for now.  I will try some Voltaren gel instead and some stretching for the knee.  I will send in meloxicam 15 mg daily.  We will see him back in 4 weeks to see how he is doing overall.  We may consider steroid injection then if needed.  Follow-Up Instructions: Return in about 4 weeks (around 04/14/2021).   Orders:  Orders Placed This Encounter  Procedures   XR HIP UNILAT W OR W/O PELVIS 2-3 VIEWS RIGHT   XR Knee 1-2 Views Right   Meds ordered this encounter  Medications   meloxicam (MOBIC) 15 MG tablet    Sig: Take 1 tablet (15 mg total) by mouth daily.    Dispense:  30 tablet    Refill:  0      Procedures: No procedures performed   Clinical Data: No additional findings.   Subjective: Chief Complaint  Patient presents with   Right Leg - Pain  The patient is an active 75 year old gentleman that I replaced his right hip in 2013.  He is listed as new patient because I have not seen him in a very long amount of time.  He comes in with right knee pain and he points to the tendons around his knee as a source of his pain with no known injury.  Is been going on for about 2 months now.  It is becoming more of a constant pain and does wake him up at night.  He denies any knee pain deeply on that right side.  He says the right hip replacement is doing well.  He does report a little bit of sciatic pain but he denies any numbness and tingling.  He is a diabetic but under good control with a hemoglobin A1c of 6.  He denies any acute injuries.  HPI  Review of Systems There is  currently listed no headache, chest pain, shortness of breath, fever, chills, nausea, vomiting  Objective: Vital Signs: There were no vitals taken for this visit.  Physical Exam He is alert and orient x3 and in no acute distress Ortho Exam Examination of his right hip shows it moves smoothly and fluidly.  Examination of the right knee shows pain over the medial lateral tendons around the knee there is no knee joint effusion and his knee moves well.  It is ligamentously stable. Specialty Comments:  No specialty comments available.  Imaging: XR HIP UNILAT W OR W/O PELVIS 2-3 VIEWS RIGHT  Result Date: 03/17/2021 An AP pelvis and lateral the right hip shows a well-seated total hip arthroplasty with no complicating features.  XR Knee 1-2 Views Right  Result Date: 03/17/2021 2 views of the right knee shows some mild arthritic changes but overall good alignment and no acute findings.    PMFS History: Patient Active Problem List   Diagnosis Date Noted   Degenerative arthritis of hip 07/03/2011  Past Medical History:  Diagnosis Date   Arthritis    arthritis right hip    COPD (chronic obstructive pulmonary disease) (HCC)    23 yrs ago dx wtih COPD no problems now    DM type 2 causing renal disease (HCC)    Elevated LDL cholesterol level    GERD (gastroesophageal reflux disease)    No longer an issue   OAB (overactive bladder)    Pneumonia     Family History  Problem Relation Age of Onset   Diabetes Mellitus II Mother    Cancer - Lung Father    Diabetes Mellitus II Sister    CVA Sister    Hypertension Sister    CVA Brother    Diabetes Mellitus II Maternal Grandmother    CVA Maternal Grandfather    CVA Paternal Grandmother    CVA Paternal Grandfather    Colon cancer Neg Hx     Past Surgical History:  Procedure Laterality Date   CATARACT EXTRACTION Bilateral 2008, 2010   CIRCUMCISION N/A 09/24/2020   Procedure: CIRCUMCISION ADULT;  Surgeon: Belva Agee, MD;   Location: [facility];  Service: Urology;  Laterality: N/A;  45 MINS   OTHER SURGICAL HISTORY     trapped nerve surgery right scapula as teenager    OTHER SURGICAL HISTORY     vesicocele    TOTAL HIP ARTHROPLASTY  07/03/2011   Procedure: TOTAL HIP ARTHROPLASTY ANTERIOR APPROACH;  Surgeon: Kathryne Hitch;  Location: [facility];  Service: Orthopedics;  Laterality: Right;   Social History   Occupational History   Not on file  Tobacco Use   Smoking status: Former    Types: Cigarettes    Quit date: 06/22/1988    Years since quitting: 32.7   Smokeless tobacco: Never  Vaping Use   Vaping Use: Never used  Substance and Sexual Activity   Alcohol use: No   Drug use: No   Sexual activity: Not on file

## 2021-04-16 ENCOUNTER — Ambulatory Visit: Payer: Medicare Other | Admitting: Orthopaedic Surgery

## 2021-04-17 ENCOUNTER — Other Ambulatory Visit: Payer: Self-pay | Admitting: Orthopaedic Surgery

## 2021-04-21 ENCOUNTER — Ambulatory Visit (INDEPENDENT_AMBULATORY_CARE_PROVIDER_SITE_OTHER): Payer: Medicare Other | Admitting: Orthopaedic Surgery

## 2021-04-21 ENCOUNTER — Other Ambulatory Visit: Payer: Self-pay

## 2021-04-21 ENCOUNTER — Encounter: Payer: Self-pay | Admitting: Orthopaedic Surgery

## 2021-04-21 DIAGNOSIS — G8929 Other chronic pain: Secondary | ICD-10-CM

## 2021-04-21 DIAGNOSIS — M25561 Pain in right knee: Secondary | ICD-10-CM

## 2021-04-21 MED ORDER — METHYLPREDNISOLONE ACETATE 40 MG/ML IJ SUSP
40.0000 mg | INTRAMUSCULAR | Status: AC | PRN
  Administered 2021-04-21: 40 mg via INTRA_ARTICULAR

## 2021-04-21 MED ORDER — LIDOCAINE HCL 1 % IJ SOLN
3.0000 mL | INTRAMUSCULAR | Status: AC | PRN
  Administered 2021-04-21: 3 mL

## 2021-04-21 NOTE — Progress Notes (Signed)
Office Visit Note   Patient: Christopher Turner           Date of Birth: 02-Apr-1946           MRN: [id number] Visit Date: 04/21/2021              Requested by: Tally Joe, MD [phone] [address] [place],  [place] [address] PCP: Tally Joe, MD   Assessment & Plan: Visit Diagnoses:  1. Chronic pain of right knee     Plan: Given the deep pain he is having in his right knee, I did recommend a steroid injection in his right knee today and he agreed to this.  I explained the risk and benefits of injections as well.  He agreed to have this done and tolerated it well.  This did help with weightbearing right away in the office.  At this point follow-up can be as needed.  I told him that if his pain comes back that my next step would be to consider MRI of the right knee to rule out an internal derangement.  He will call if there is an issue.  Follow-Up Instructions: Return if symptoms worsen or fail to improve.   Orders:  Orders Placed This Encounter  Procedures   Large Joint Inj    No orders of the defined types were placed in this encounter.     Procedures: Large Joint Inj: R knee on 04/21/2021 9:18 AM Indications: diagnostic evaluation and pain Details: 22 G 1.5 in needle, superolateral approach  Arthrogram: No  Medications: 3 mL lidocaine 1 %; 40 mg methylPREDNISolone acetate 40 MG/ML Outcome: tolerated well, no immediate complications Procedure, treatment alternatives, risks and benefits explained, specific risks discussed. Consent was given by the patient. Immediately prior to procedure a time out was called to verify the correct patient, procedure, equipment, support staff and site/side marked as required. Patient was prepped and draped in the usual sterile fashion.      Clinical Data: No additional findings.   Subjective: Chief Complaint  Patient presents with   Right Knee - Pain, Follow-up  The patient is an active 75 year old gentleman that I been seeing  for his right knee.  He has been having pain along the posterior aspect of the right knee and lateral.  He felt this was more of a ligament issue.  It does hurt with weightbearing and other activities.  He has not had a steroid injection and he deferred this at the last visit wanting to try Voltaren gel and stretching.  He is still having some pain with activities.  X-rays showed only some mild arthritic changes in right knee.  He does not have pain that wakes him up at night.  It has been more mobility related type of pain.  He denies any injury to the knee.  He denies any locking catching.  HPI  Review of Systems There is no listed fever, chills, nausea, vomiting  Objective: Vital Signs: There were no vitals taken for this visit.  Physical Exam He is alert and orient x3 and in no acute distress Ortho Exam Examination of his right knee shows no effusion.  His pain seems to be in the posterior lateral corner of the knee and deep in the knee joint itself.  There is no effusion.  His knee is ligamentously stable. Specialty Comments:  No specialty comments available.  Imaging: No results found.   PMFS History: Patient Active Problem List   Diagnosis Date Noted  Degenerative arthritis of hip 07/03/2011   Past Medical History:  Diagnosis Date   Arthritis    arthritis right hip    COPD (chronic obstructive pulmonary disease) (HCC)    23 yrs ago dx wtih COPD no problems now    DM type 2 causing renal disease (HCC)    Elevated LDL cholesterol level    GERD (gastroesophageal reflux disease)    No longer an issue   OAB (overactive bladder)    Pneumonia     Family History  Problem Relation Age of Onset   Diabetes Mellitus II Mother    Cancer - Lung Father    Diabetes Mellitus II Sister    CVA Sister    Hypertension Sister    CVA Brother    Diabetes Mellitus II Maternal Grandmother    CVA Maternal Grandfather    CVA Paternal Grandmother    CVA Paternal Grandfather    Colon  cancer Neg Hx     Past Surgical History:  Procedure Laterality Date   CATARACT EXTRACTION Bilateral 2008, 2010   CIRCUMCISION N/A 09/24/2020   Procedure: CIRCUMCISION ADULT;  Surgeon: Belva Agee, MD;  Location: [facility];  Service: Urology;  Laterality: N/A;  45 MINS   OTHER SURGICAL HISTORY     trapped nerve surgery right scapula as teenager    OTHER SURGICAL HISTORY     vesicocele    TOTAL HIP ARTHROPLASTY  07/03/2011   Procedure: TOTAL HIP ARTHROPLASTY ANTERIOR APPROACH;  Surgeon: Kathryne Hitch;  Location: [facility];  Service: Orthopedics;  Laterality: Right;   Social History   Occupational History   Not on file  Tobacco Use   Smoking status: Former    Types: Cigarettes    Quit date: 06/22/1988    Years since quitting: 32.8   Smokeless tobacco: Never  Vaping Use   Vaping Use: Never used  Substance and Sexual Activity   Alcohol use: No   Drug use: No   Sexual activity: Not on file

## 2021-08-08 DIAGNOSIS — Z0000 Encounter for general adult medical examination without abnormal findings: Secondary | ICD-10-CM | POA: Diagnosis not present

## 2021-08-08 DIAGNOSIS — N1831 Chronic kidney disease, stage 3a: Secondary | ICD-10-CM | POA: Diagnosis not present

## 2021-08-08 DIAGNOSIS — N3281 Overactive bladder: Secondary | ICD-10-CM | POA: Diagnosis not present

## 2021-08-08 DIAGNOSIS — D692 Other nonthrombocytopenic purpura: Secondary | ICD-10-CM | POA: Diagnosis not present

## 2021-08-08 DIAGNOSIS — M1711 Unilateral primary osteoarthritis, right knee: Secondary | ICD-10-CM | POA: Diagnosis not present

## 2021-08-08 DIAGNOSIS — E1122 Type 2 diabetes mellitus with diabetic chronic kidney disease: Secondary | ICD-10-CM | POA: Diagnosis not present

## 2021-08-08 DIAGNOSIS — Z7984 Long term (current) use of oral hypoglycemic drugs: Secondary | ICD-10-CM | POA: Diagnosis not present

## 2021-08-08 DIAGNOSIS — J449 Chronic obstructive pulmonary disease, unspecified: Secondary | ICD-10-CM | POA: Diagnosis not present

## 2021-08-08 DIAGNOSIS — E7800 Pure hypercholesterolemia, unspecified: Secondary | ICD-10-CM | POA: Diagnosis not present

## 2021-08-08 DIAGNOSIS — Z1389 Encounter for screening for other disorder: Secondary | ICD-10-CM | POA: Diagnosis not present

## 2021-08-08 DIAGNOSIS — I7143 Infrarenal abdominal aortic aneurysm, without rupture: Secondary | ICD-10-CM | POA: Diagnosis not present

## 2021-08-08 DIAGNOSIS — M25551 Pain in right hip: Secondary | ICD-10-CM | POA: Diagnosis not present

## 2021-08-11 ENCOUNTER — Other Ambulatory Visit: Payer: Self-pay

## 2021-08-11 ENCOUNTER — Encounter: Payer: Self-pay | Admitting: Physician Assistant

## 2021-08-11 ENCOUNTER — Ambulatory Visit: Payer: Self-pay

## 2021-08-11 ENCOUNTER — Ambulatory Visit (INDEPENDENT_AMBULATORY_CARE_PROVIDER_SITE_OTHER): Payer: Medicare Other | Admitting: Physician Assistant

## 2021-08-11 DIAGNOSIS — M79604 Pain in right leg: Secondary | ICD-10-CM

## 2021-08-11 NOTE — Addendum Note (Signed)
Addended by: Barbette Or on: 08/11/2021 10:47 AM   Modules accepted: Orders

## 2021-08-11 NOTE — Progress Notes (Signed)
Office Visit Note   Patient: Christopher Turner           Date of Birth: 1946-01-05           MRN: [id number] Visit Date: 08/11/2021              Requested by: Tally Joe, MD [phone] [address] [place],  [place] [address] PCP: Tally Joe, MD   Assessment & Plan: Visit Diagnoses:  1. Pain in right leg     Plan: He will continue his over-the-counter NSAIDs as these of been helpful.  Did not recommend Medrol Dosepak due to his elevated hemoglobin A1c.  We will send him to formal physical therapy for his back this will include core strengthening, back exercises, home exercise program and modalities.  He will follow-up with [place] 6 weeks sooner if there is any questions concerns.  If his radicular symptoms down the leg continue neck step would be MRI to rule out HNP as a source of his radicular symptoms.  Questions were encouraged and answered.  Follow-Up Instructions: Return in about 6 weeks (around 09/22/2021).   Orders:  Orders Placed This Encounter  Procedures   XR Lumbar Spine 2-3 Views   No orders of the defined types were placed in this encounter.     Procedures: No procedures performed   Clinical Data: No additional findings.   Subjective: Chief Complaint  Patient presents with   Right Knee - Pain, Follow-up    HPI Christopher Turner 76 year old male who was seen by Dr. Magnus Ivan on October 31 due to knee pain.  He was given a steroid injection and has been using Voltaren gel on his knee.  He states that the injection and the Voltaren gel was given and no relief from the lateral right knee pain.  His pain now is progressed to pain that radiates from his right buttocks down into his right foot.  Describes numbness tingling burning pain at times.  Pain is worse with sitting.  He takes over-the-counter NSAIDs which does help with his pain down the leg.  Denies any fevers however he does have occasional cold sweats at night.  Denies any bowel or bladder dysfunction saddle  anesthesia like symptoms are waking him back pain in fact he denies any back pain whatsoever.  Does recall that he was pulling on a tree root and fell onto a railroad tie sometime in August fell onto the right side.  History of right total hip arthroplasty which is doing well.  Review of Systems See HPI  Objective: Vital Signs: There were no vitals taken for this visit.  Physical Exam General: Well-developed well-nourished male in no acute distress mood affect appropriate Psych: Alert and oriented x3  Ortho Exam Lower extremities he has 5 out of 5 strength throughout lower extremities against resistance.  Sensation grossly intact bilateral feet light touch.  In positive straight leg raise on the right negative on the left.  Nontender over the lumbar spine and paraspinous region bilaterally.  He comes within 3 inches of [place]mina will touch his toes and has slightly limited extension lumbar spine.  Specialty Comments:  No specialty comments available.  Imaging: XR Lumbar Spine 2-3 Views  Result Date: 08/11/2021 Lumbar spine 2 views: No acute fracture.  Slight loss of lordotic curvature.  Facet arthritic changes L4-5 and L5-S1.  Multiple levels with anterior endplate spurring.  Disc base overall well-maintained.  Arthrosclerosis aorta.    PMFS History: Patient Active  Problem List   Diagnosis Date Noted   Degenerative arthritis of hip 07/03/2011   Past Medical History:  Diagnosis Date   Arthritis    arthritis right hip    COPD (chronic obstructive pulmonary disease) (HCC)    23 yrs ago dx wtih COPD no problems now    DM type 2 causing renal disease (HCC)    Elevated LDL cholesterol level    GERD (gastroesophageal reflux disease)    No longer an issue   OAB (overactive bladder)    Pneumonia     Family History  Problem Relation Age of Onset   Diabetes Mellitus II Mother    Cancer - Lung Father    Diabetes Mellitus II Sister    CVA Sister    Hypertension Sister    CVA Brother     Diabetes Mellitus II Maternal Grandmother    CVA Maternal Grandfather    CVA Paternal Grandmother    CVA Paternal Grandfather    Colon cancer Neg Hx     Past Surgical History:  Procedure Laterality Date   CATARACT EXTRACTION Bilateral 2008, 2010   CIRCUMCISION N/A 09/24/2020   Procedure: CIRCUMCISION ADULT;  Surgeon: Belva Agee, MD;  Location: [facility];  Service: Urology;  Laterality: N/A;  45 MINS   OTHER SURGICAL HISTORY     trapped nerve surgery right scapula as teenager    OTHER SURGICAL HISTORY     vesicocele    TOTAL HIP ARTHROPLASTY  07/03/2011   Procedure: TOTAL HIP ARTHROPLASTY ANTERIOR APPROACH;  Surgeon: Kathryne Hitch;  Location: [facility];  Service: Orthopedics;  Laterality: Right;   Social History   Occupational History   Not on file  Tobacco Use   Smoking status: Former    Types: Cigarettes    Quit date: 06/22/1988    Years since quitting: 33.1   Smokeless tobacco: Never  Vaping Use   Vaping Use: Never used  Substance and Sexual Activity   Alcohol use: No   Drug use: No   Sexual activity: Not on file

## 2021-08-18 ENCOUNTER — Ambulatory Visit
Admission: RE | Admit: 2021-08-18 | Discharge: 2021-08-18 | Disposition: A | Discharge status: Home / Self Care | Payer: Medicare Other | Source: Ambulatory Visit | Attending: Family Medicine | Admitting: Family Medicine

## 2021-08-18 DIAGNOSIS — I7143 Infrarenal abdominal aortic aneurysm, without rupture: Secondary | ICD-10-CM | POA: Diagnosis not present

## 2021-08-22 ENCOUNTER — Ambulatory Visit: Payer: Medicare Other | Admitting: Physical Therapy

## 2021-08-28 ENCOUNTER — Ambulatory Visit: Payer: Medicare Other | Admitting: Rehabilitative and Restorative Service Providers"

## 2021-09-10 ENCOUNTER — Encounter: Payer: Self-pay | Admitting: Rehabilitative and Restorative Service Providers"

## 2021-09-10 ENCOUNTER — Ambulatory Visit: Payer: Medicare Other | Admitting: Rehabilitative and Restorative Service Providers"

## 2021-09-10 ENCOUNTER — Other Ambulatory Visit: Payer: Self-pay

## 2021-09-10 DIAGNOSIS — R262 Difficulty in walking, not elsewhere classified: Secondary | ICD-10-CM | POA: Diagnosis not present

## 2021-09-10 DIAGNOSIS — M6281 Muscle weakness (generalized): Secondary | ICD-10-CM | POA: Diagnosis not present

## 2021-09-10 DIAGNOSIS — M25561 Pain in right knee: Secondary | ICD-10-CM | POA: Diagnosis not present

## 2021-09-10 DIAGNOSIS — G8929 Other chronic pain: Secondary | ICD-10-CM

## 2021-09-10 DIAGNOSIS — M25661 Stiffness of right knee, not elsewhere classified: Secondary | ICD-10-CM | POA: Diagnosis not present

## 2021-09-10 DIAGNOSIS — R293 Abnormal posture: Secondary | ICD-10-CM

## 2021-09-10 DIAGNOSIS — R600 Localized edema: Secondary | ICD-10-CM

## 2021-09-10 NOTE — Therapy (Addendum)
OUTPATIENT PHYSICAL THERAPY THORACOLUMBAR EVALUATION   Patient Name: Christopher Turner MRN: [id number] DOB:[birth year], 76 y.o., male Today's Date: 11/28/2021  PHYSICAL THERAPY DISCHARGE SUMMARY  Visits from Start of Care: Today only  Current functional level related to goals / functional outcomes: See note   Remaining deficits: See note   Education / Equipment: Starter HEP   Patient agrees to discharge. Patient goals were  set . Patient is being discharged due to not returning since the last visit.      Past Medical History:  Diagnosis Date   Arthritis    arthritis right hip    COPD (chronic obstructive pulmonary disease) (HCC)    23 yrs ago dx wtih COPD no problems now    DM type 2 causing renal disease (HCC)    Elevated LDL cholesterol level    GERD (gastroesophageal reflux disease)    No longer an issue   OAB (overactive bladder)    Pneumonia    Past Surgical History:  Procedure Laterality Date   CATARACT EXTRACTION Bilateral 2008, 2010   CIRCUMCISION N/A 09/24/2020   Procedure: CIRCUMCISION ADULT;  Surgeon: Belva Agee, MD;  Location: [facility];  Service: Urology;  Laterality: N/A;  45 MINS   OTHER SURGICAL HISTORY     trapped nerve surgery right scapula as teenager    OTHER SURGICAL HISTORY     vesicocele    TOTAL HIP ARTHROPLASTY  07/03/2011   Procedure: TOTAL HIP ARTHROPLASTY ANTERIOR APPROACH;  Surgeon: Kathryne Hitch;  Location: [facility];  Service: Orthopedics;  Laterality: Right;   Patient Active Problem List   Diagnosis Date Noted   Degenerative arthritis of hip 07/03/2011    PCP: Tally Joe, MD  REFERRING PROVIDER: Kirtland Bouchard, PA-C  REFERRING DIAG: [phone] (ICD-10-CM) - Pain in right leg   THERAPY DIAG:  Abnormal posture - Plan: PT plan of care cert/re-cert  Difficulty in walking, not elsewhere classified - Plan: PT plan of care cert/re-cert  Localized edema - Plan: PT plan of care cert/re-cert  Chronic pain of right knee -  Plan: PT plan of care cert/re-cert  Muscle weakness (generalized) - Plan: PT plan of care cert/re-cert  Stiffness of right knee, not elsewhere classified - Plan: PT plan of care cert/re-cert  ONSET DATE: Since last summer  SUBJECTIVE:                                                                                                                                                                                           SUBJECTIVE STATEMENT: [name] notes R knee pain and tingling below the knee on the R side.  He  notes difficulty with sit to stand.  Symptoms disappear with 3 ibuprofen.  He notes he is more concerned with the knee pain than the tingling.  PERTINENT HISTORY:  COPD, Type 2 DM, R THA, hiatal hernia  PAIN:  Are you having pain? Yes: NPRS scale: 0-4/10 Pain location: R knee Pain description: Ache with more intensity with overuse or sit to stand Aggravating factors: Getting up from a low seat, too much activity Relieving factors: 3 Ibuprofen  and Voltarin gel   PRECAUTIONS: Knee and Back  WEIGHT BEARING RESTRICTIONS No  FALLS:  Has patient fallen in last 6 months? No, Number of falls: 0  LIVING ENVIRONMENT: Lives with: lives alone Lives in: House/apartment Stairs: No Has following equipment at home: None  OCCUPATION: Retired  PLOF: Independent  PATIENT GOALS Get back to pain-free yard work, house chores (car).   OBJECTIVE:   DIAGNOSTIC FINDINGS:  Mild DJD R knee and mild to moderate DDD/DJD lumbar spine.  PATIENT SURVEYS:  FOTO 56 (Goal 66 in 13 visits)  SCREENING FOR RED FLAGS: Bowel or bladder incontinence: No Compression fracture: No  COGNITION:  Overall cognitive status: Within functional limits for tasks assessed     SENSATION: Not tested   POSTURE:  Significant for forward head, IR and protracted shoulders and decreased lumbar lordosis.  LUMBAR ROM:   Active  A/PROM  11/28/2021  Flexion   Lumbar Extension 10  Right knee flexion 133   Left knee flexion 143  Right knee extension 0  Left knee extension 0   (Blank rows = not tested)  LE Thigh Girth Assessment: 47.5/48.0 cm thigh girth (L/R)   LE MMT:  Hand held dynamometry Right 11/28/2021 Left 11/28/2021  Hip flexion    Hip extension    Hip abduction    Hip adduction    Hip internal rotation    Hip external rotation    Knee flexion    Knee extension 76.6 pounds 83.3 pounds  Ankle dorsiflexion    Ankle plantarflexion    Ankle inversion    Ankle eversion     (Blank rows = not tested)   TODAY'S TREATMENT  Trunk extension AROM (hands in back pockets - low) 10X 3 seconds Shoulder blade pinches 10X 5 seconds Seated straight leg raises 5X 3 seconds B (slow eccentrics, pause before lifting)   PATIENT EDUCATION:  Education details: Reviewed exam findings, HEP, discussed imaging, posture and POC. Person educated: Patient Education method: Explanation, Demonstration, Tactile cues, Verbal cues, and Handouts Education comprehension: verbalized understanding, returned demonstration, verbal cues required, tactile cues required, and needs further education   HOME EXERCISE PROGRAM: Access Code: [email or web address] URL: [email or web address] Date: 09/10/2021 Prepared by: Pauletta Brownsobert Mainor Hellmann  Exercises Standing Lumbar Extension at Wall - Forearms - 5 x daily - 7 x weekly - 1 sets - 5 reps - 3 seconds hold Standing Scapular Retraction - 5 x daily - 7 x weekly - 1 sets - 5 reps - 5 second hold Small Range Straight Leg Raise - 1-2 x daily - 7 x weekly - 4-10 sets - 5 reps - 3 seconds hold   ASSESSMENT:  CLINICAL IMPRESSION: Patient is a 76 y.o. male who was seen today for physical therapy evaluation and treatment for R knee pain and R LE radiculopathy.  [name] reports being more concerned with his knee and difficulty with sit to stand and knee strength vs occasional R LE tingling.  We discussed addressing both with an emphasis on strengthening his R quadriceps and leg  while  still correcting his postures and strengthening his low back.  His prognosis is good to meet the below listed goals.   OBJECTIVE IMPAIRMENTS Abnormal gait, decreased activity tolerance, decreased endurance, decreased knowledge of condition, difficulty walking, decreased ROM, decreased strength, decreased safety awareness, increased edema, improper body mechanics, postural dysfunction, and pain. Localized edema.  ACTIVITY LIMITATIONS cleaning, community activity, and yard work.   PERSONAL FACTORS COPD, Type 2 DM, R THA and a hiatal hernia are also affecting this patient's functional outcome.    REHAB POTENTIAL: Good  CLINICAL DECISION MAKING: Stable/uncomplicated  EVALUATION COMPLEXITY: Low   GOALS: Goals reviewed with patient? Yes  SHORT TERM GOALS: Target date: 12/26/2021  Branden will be independent and compliant with his day 1 HEP prescription. Baseline: Started 09/10/2021 Goal status: INITIAL  LONG TERM GOALS: Target date: 01/09/2022  Improve FOTO to 66. Baseline: 56 Goal status: INITIAL  2.  Improve R quadriceps strength as assessed by objective progress, FOTO scores and sit to stand quality. Baseline: See FOTO, objective measures.  Difficulty with sit to stand at evaluation. Goal status: INITIAL  3.  Improve R knee flexion AROM to 140 degrees. Baseline: 133 degrees. Goal status: INITIAL  4.  Aladdin will report no or infrequent R LE "tingling." Baseline: Occasional tingling. Goal status: INITIAL  5.  Lonell will be independent with his long-term HEP at [place]. Baseline: Started 09/10/2021 Goal status: INITIAL   PLAN: PT FREQUENCY: 1-2x/week  PT DURATION: 6 weeks  PLANNED INTERVENTIONS: Therapeutic exercises, Therapeutic activity, Neuromuscular re-education, Balance training, Gait training, Patient/Family education, Joint mobilization, Stair training, Cryotherapy, and Manual therapy.  Vasopneumatic possibly.  PLAN FOR NEXT SESSION: Review HEP, progress low back  and LE strength (recumbent bike, leg press single and double leg slow eccentrics, prone lumbar strengthening).   Cherlyn Cushing, PT, MPT 11/28/2021, 10:56 AM

## 2021-09-22 ENCOUNTER — Ambulatory Visit: Payer: Medicare Other | Admitting: Orthopaedic Surgery

## 2021-09-23 ENCOUNTER — Encounter: Payer: Medicare Other | Admitting: Rehabilitative and Restorative Service Providers"

## 2021-09-30 ENCOUNTER — Encounter: Payer: Medicare Other | Admitting: Rehabilitative and Restorative Service Providers"

## 2021-10-02 ENCOUNTER — Encounter: Payer: Medicare Other | Admitting: Rehabilitative and Restorative Service Providers"

## 2021-10-08 ENCOUNTER — Encounter: Payer: Medicare Other | Admitting: Rehabilitative and Restorative Service Providers"

## 2021-10-10 ENCOUNTER — Encounter: Payer: Medicare Other | Admitting: Rehabilitative and Restorative Service Providers"

## 2021-10-15 ENCOUNTER — Encounter: Payer: Medicare Other | Admitting: Rehabilitative and Restorative Service Providers"

## 2021-10-17 ENCOUNTER — Encounter: Payer: Medicare Other | Admitting: Rehabilitative and Restorative Service Providers"

## 2022-02-05 DIAGNOSIS — M25561 Pain in right knee: Secondary | ICD-10-CM | POA: Diagnosis not present

## 2022-02-05 DIAGNOSIS — E7800 Pure hypercholesterolemia, unspecified: Secondary | ICD-10-CM | POA: Diagnosis not present

## 2022-02-05 DIAGNOSIS — Z7984 Long term (current) use of oral hypoglycemic drugs: Secondary | ICD-10-CM | POA: Diagnosis not present

## 2022-02-05 DIAGNOSIS — E1122 Type 2 diabetes mellitus with diabetic chronic kidney disease: Secondary | ICD-10-CM | POA: Diagnosis not present

## 2022-02-05 DIAGNOSIS — N3281 Overactive bladder: Secondary | ICD-10-CM | POA: Diagnosis not present

## 2022-02-05 DIAGNOSIS — J449 Chronic obstructive pulmonary disease, unspecified: Secondary | ICD-10-CM | POA: Diagnosis not present

## 2022-02-05 DIAGNOSIS — N1831 Chronic kidney disease, stage 3a: Secondary | ICD-10-CM | POA: Diagnosis not present

## 2022-07-27 ENCOUNTER — Other Ambulatory Visit: Payer: Self-pay | Admitting: Physician Assistant

## 2022-07-27 DIAGNOSIS — Z86010 Personal history of colonic polyps: Secondary | ICD-10-CM | POA: Diagnosis not present

## 2022-07-27 DIAGNOSIS — K219 Gastro-esophageal reflux disease without esophagitis: Secondary | ICD-10-CM

## 2022-07-27 DIAGNOSIS — R21 Rash and other nonspecific skin eruption: Secondary | ICD-10-CM | POA: Diagnosis not present

## 2022-07-27 DIAGNOSIS — K5900 Constipation, unspecified: Secondary | ICD-10-CM | POA: Diagnosis not present

## 2022-07-27 DIAGNOSIS — B029 Zoster without complications: Secondary | ICD-10-CM | POA: Diagnosis not present

## 2022-07-27 DIAGNOSIS — R1310 Dysphagia, unspecified: Secondary | ICD-10-CM

## 2022-07-27 DIAGNOSIS — K2270 Barrett's esophagus without dysplasia: Secondary | ICD-10-CM | POA: Diagnosis not present

## 2022-08-07 DIAGNOSIS — E1165 Type 2 diabetes mellitus with hyperglycemia: Secondary | ICD-10-CM | POA: Diagnosis not present

## 2022-08-07 DIAGNOSIS — E7800 Pure hypercholesterolemia, unspecified: Secondary | ICD-10-CM | POA: Diagnosis not present

## 2022-08-07 DIAGNOSIS — D692 Other nonthrombocytopenic purpura: Secondary | ICD-10-CM | POA: Diagnosis not present

## 2022-08-07 DIAGNOSIS — M1711 Unilateral primary osteoarthritis, right knee: Secondary | ICD-10-CM | POA: Diagnosis not present

## 2022-08-07 DIAGNOSIS — I7143 Infrarenal abdominal aortic aneurysm, without rupture: Secondary | ICD-10-CM | POA: Diagnosis not present

## 2022-08-07 DIAGNOSIS — N1831 Chronic kidney disease, stage 3a: Secondary | ICD-10-CM | POA: Diagnosis not present

## 2022-08-07 DIAGNOSIS — E1122 Type 2 diabetes mellitus with diabetic chronic kidney disease: Secondary | ICD-10-CM | POA: Diagnosis not present

## 2022-08-07 DIAGNOSIS — Z0000 Encounter for general adult medical examination without abnormal findings: Secondary | ICD-10-CM | POA: Diagnosis not present

## 2022-08-07 DIAGNOSIS — Z23 Encounter for immunization: Secondary | ICD-10-CM | POA: Diagnosis not present

## 2022-08-07 DIAGNOSIS — J449 Chronic obstructive pulmonary disease, unspecified: Secondary | ICD-10-CM | POA: Diagnosis not present

## 2022-08-11 ENCOUNTER — Ambulatory Visit
Admission: RE | Admit: 2022-08-11 | Discharge: 2022-08-11 | Disposition: A | Discharge status: Home / Self Care | Payer: Medicare Other | Source: Ambulatory Visit | Attending: Physician Assistant | Admitting: Physician Assistant

## 2022-08-11 DIAGNOSIS — K449 Diaphragmatic hernia without obstruction or gangrene: Secondary | ICD-10-CM | POA: Diagnosis not present

## 2022-08-11 DIAGNOSIS — K219 Gastro-esophageal reflux disease without esophagitis: Secondary | ICD-10-CM | POA: Diagnosis not present

## 2022-08-11 DIAGNOSIS — R1310 Dysphagia, unspecified: Secondary | ICD-10-CM

## 2022-08-11 DIAGNOSIS — K224 Dyskinesia of esophagus: Secondary | ICD-10-CM | POA: Diagnosis not present

## 2022-08-19 ENCOUNTER — Ambulatory Visit (INDEPENDENT_AMBULATORY_CARE_PROVIDER_SITE_OTHER): Payer: Medicare Other

## 2022-08-19 ENCOUNTER — Encounter: Payer: Self-pay | Admitting: Physician Assistant

## 2022-08-19 ENCOUNTER — Ambulatory Visit: Payer: Medicare Other | Admitting: Physician Assistant

## 2022-08-19 DIAGNOSIS — M542 Cervicalgia: Secondary | ICD-10-CM | POA: Diagnosis not present

## 2022-08-19 DIAGNOSIS — M25512 Pain in left shoulder: Secondary | ICD-10-CM

## 2022-08-19 DIAGNOSIS — G8929 Other chronic pain: Secondary | ICD-10-CM

## 2022-08-19 MED ORDER — BUPIVACAINE HCL 0.25 % IJ SOLN
2.0000 mL | INTRAMUSCULAR | Status: AC | PRN
  Administered 2022-08-19: 2 mL via INTRA_ARTICULAR

## 2022-08-19 MED ORDER — MELOXICAM 7.5 MG PO TABS: 7.5000 mg | ORAL_TABLET | Freq: Every day | ORAL | 0 refills | Status: DC

## 2022-08-19 MED ORDER — LIDOCAINE HCL 1 % IJ SOLN
2.0000 mL | INTRAMUSCULAR | Status: AC | PRN
  Administered 2022-08-19: 2 mL

## 2022-08-19 MED ORDER — METHYLPREDNISOLONE ACETATE 40 MG/ML IJ SUSP
80.0000 mg | INTRAMUSCULAR | Status: AC | PRN
  Administered 2022-08-19: 80 mg via INTRA_ARTICULAR

## 2022-08-19 NOTE — Progress Notes (Signed)
Office Visit Note   Patient: Christopher Turner           Date of Birth: 22-Apr-1946           MRN: [id number] Visit Date: 08/19/2022              Requested by: Antony Contras, MD [place],  [place] [id number] PCP: Antony Contras, MD  Chief Complaint  Patient presents with   Neck - Pain   Left Shoulder - Pain      HPI: Christopher Turner comes in today with a chief complaint of left-sided neck pain and left shoulder pain.  He said about 4 weeks ago he did have shingles in this area.  This was treated by his primary care provider and is currently taking pregabalin for some of the neuropathic symptoms.  He has never had problems with his shoulder or neck before.  He has pain on the left side of his neck not really in the musculature.  He also has pain and what he calls as it looks different over his [facility] joint.  Denies any paresthesias.  He has been taking some anti-inflammatories with some relief.   Assessment & Plan: Visit Diagnoses:  1. Chronic left shoulder pain   2. Neck pain     Plan: I had a long discussion with him.  I would like to have him recheck with his primary care if he continues to have pain and this will part of his neck as it is not really over clearly over musculature.  I could not palpate a mass or anything.  I do think he has some neck spasm and neck arthritis as well as some AC arthritis and impingement findings.  Will try an injection of some steroid into his shoulder today he knows this will elevate his blood sugar slightly and he is okay with that.  Also would like for him to engage with physical therapy.  I will call him in a low-dose of meloxicam.  Can follow-up with myself or Dr. Ninfa Linden in a month  Follow-Up Instructions: No follow-ups on file.   Ortho Exam  Patient is alert, oriented, no adenopathy, well-dressed, normal affect, normal respiratory effort. Examination of his neck he has good forward flexion extension side-to-side turning without any  reproduction of symptoms in his left arm.  Grip strength is 5 out of 5 he has equal good triceps and biceps strength.  He has good forward elevation over his head with his left arm some discomfort with internal rotation behind his back.  Mild to moderate impingement findings with empty can test and some pain with speeds testing.  He is neurovascular intact  Imaging: XR Cervical Spine 2 or 3 views  Result Date: 08/19/2022 2 view cervical spine demonstrates degenerative changes at C5-6 with almost complete loss of joint space also osteophyte formation and degenerative changes throughout the spine with some straightening of the normal lordotic curve  XR Shoulder Left  Result Date: 08/19/2022 Radiographs of the left shoulder demonstrate degenerative changes of the [facility] joint with osteophyte formation and sclerotic changes.  Humeral head is reduced well in the glenoid fossa no fractures noted  No images are attached to the encounter.  Labs: Lab Results  Component Value Date   HGBA1C 8.1 (H) 09/19/2020     No results found for: "ALBUMIN", "PREALBUMIN", "CBC"  No results found for: "MG" No results found for: "VD25OH"  No results found for: "PREALBUMIN"  Latest Ref Rng & Units 09/19/2020   10:30 AM 07/06/2011    4:15 AM 07/05/2011    5:22 AM  CBC EXTENDED  WBC 4.0 - 10.5 K/uL 5.9  9.0  8.9   RBC 4.22 - 5.81 MIL/uL 4.87  3.75  3.81   Hemoglobin 13.0 - 17.0 g/dL 15.9  11.3  11.4   HCT 39.0 - 52.0 % 46.8  33.0  33.9   Platelets 150 - 400 K/uL 226  208  192      There is no height or weight on file to calculate BMI.  Orders:  Orders Placed This Encounter  Procedures   XR Cervical Spine 2 or 3 views   XR Shoulder Left   Ambulatory referral to Physical Therapy   Meds ordered this encounter  Medications   meloxicam (MOBIC) 7.5 MG tablet    Sig: Take 1 tablet (7.5 mg total) by mouth daily.    Dispense:  30 tablet    Refill:  0     Procedures: Large Joint Inj: L subacromial bursa  on 08/19/2022 3:30 PM Indications: diagnostic evaluation and pain Details: 25 G 1.5 in needle, posterior approach  Arthrogram: No  Medications: 2 mL lidocaine 1 %; 80 mg methylPREDNISolone acetate 40 MG/ML; 2 mL bupivacaine 0.25 % Outcome: tolerated well, no immediate complications Procedure, treatment alternatives, risks and benefits explained, specific risks discussed. Consent was given by the patient.    Clinical Data: No additional findings.  ROS:  All other systems negative, except as noted in the HPI. Review of Systems  Objective: Vital Signs: There were no vitals taken for this visit.  Specialty Comments:  No specialty comments available.  PMFS History: Patient Active Problem List   Diagnosis Date Noted   Neck pain 08/19/2022   Degenerative arthritis of hip 07/03/2011   Past Medical History:  Diagnosis Date   Arthritis    arthritis right hip    COPD (chronic obstructive pulmonary disease) ([place])    23 yrs ago dx wtih COPD no problems now    DM type 2 causing renal disease (HCC)    Elevated LDL cholesterol level    GERD (gastroesophageal reflux disease)    No longer an issue   OAB (overactive bladder)    Pneumonia     Family History  Problem Relation Age of Onset   Diabetes Mellitus II Mother    Cancer - Lung Father    Diabetes Mellitus II Sister    CVA Sister    Hypertension Sister    CVA Brother    Diabetes Mellitus II Maternal Grandmother    CVA Maternal Grandfather    CVA Paternal Grandmother    CVA Paternal Grandfather    Colon cancer Neg Hx     Past Surgical History:  Procedure Laterality Date   CATARACT EXTRACTION Bilateral 2008, 2010   CIRCUMCISION N/A 09/24/2020   Procedure: CIRCUMCISION ADULT;  Surgeon: Remi Haggard, MD;  Location: [facility];  Service: Urology;  Laterality: N/A;  45 MINS   OTHER SURGICAL HISTORY     trapped nerve surgery right scapula as teenager    OTHER SURGICAL HISTORY     vesicocele    TOTAL HIP ARTHROPLASTY   07/03/2011   Procedure: TOTAL HIP ARTHROPLASTY ANTERIOR APPROACH;  Surgeon: Mcarthur Rossetti;  Location: [facility];  Service: Orthopedics;  Laterality: Right;   Social History   Occupational History   Not on file  Tobacco Use   Smoking status: Former    Types:  Cigarettes    Quit date: 06/22/1988    Years since quitting: 34.1   Smokeless tobacco: Never  Vaping Use   Vaping Use: Never used  Substance and Sexual Activity   Alcohol use: No   Drug use: No   Sexual activity: Not on file

## 2022-08-19 NOTE — Progress Notes (Signed)
pt

## 2022-09-03 DIAGNOSIS — K5730 Diverticulosis of large intestine without perforation or abscess without bleeding: Secondary | ICD-10-CM | POA: Diagnosis not present

## 2022-09-03 DIAGNOSIS — K2980 Duodenitis without bleeding: Secondary | ICD-10-CM | POA: Diagnosis not present

## 2022-09-03 DIAGNOSIS — K2270 Barrett's esophagus without dysplasia: Secondary | ICD-10-CM | POA: Diagnosis not present

## 2022-09-03 DIAGNOSIS — R1310 Dysphagia, unspecified: Secondary | ICD-10-CM | POA: Diagnosis not present

## 2022-09-03 DIAGNOSIS — Z09 Encounter for follow-up examination after completed treatment for conditions other than malignant neoplasm: Secondary | ICD-10-CM | POA: Diagnosis not present

## 2022-09-03 DIAGNOSIS — K449 Diaphragmatic hernia without obstruction or gangrene: Secondary | ICD-10-CM | POA: Diagnosis not present

## 2022-09-03 DIAGNOSIS — K648 Other hemorrhoids: Secondary | ICD-10-CM | POA: Diagnosis not present

## 2022-09-03 DIAGNOSIS — Z86010 Personal history of colonic polyps: Secondary | ICD-10-CM | POA: Diagnosis not present

## 2022-09-07 DIAGNOSIS — K2980 Duodenitis without bleeding: Secondary | ICD-10-CM | POA: Diagnosis not present

## 2022-09-07 DIAGNOSIS — K2270 Barrett's esophagus without dysplasia: Secondary | ICD-10-CM | POA: Diagnosis not present

## 2022-09-08 ENCOUNTER — Ambulatory Visit: Payer: Medicare Other | Admitting: Physical Therapy

## 2022-09-17 ENCOUNTER — Ambulatory Visit: Payer: Medicare Other | Admitting: Internal Medicine

## 2022-09-22 ENCOUNTER — Ambulatory Visit: Payer: Medicare Other | Admitting: Physician Assistant

## 2022-09-23 ENCOUNTER — Ambulatory Visit: Payer: Medicare Other | Admitting: Physician Assistant

## 2022-10-09 DIAGNOSIS — Z86010 Personal history of colonic polyps: Secondary | ICD-10-CM | POA: Diagnosis not present

## 2022-10-09 DIAGNOSIS — K921 Melena: Secondary | ICD-10-CM | POA: Diagnosis not present

## 2022-10-09 DIAGNOSIS — K449 Diaphragmatic hernia without obstruction or gangrene: Secondary | ICD-10-CM | POA: Diagnosis not present

## 2022-10-09 DIAGNOSIS — K2270 Barrett's esophagus without dysplasia: Secondary | ICD-10-CM | POA: Diagnosis not present

## 2022-10-13 DIAGNOSIS — K449 Diaphragmatic hernia without obstruction or gangrene: Secondary | ICD-10-CM | POA: Diagnosis not present

## 2022-10-13 DIAGNOSIS — K219 Gastro-esophageal reflux disease without esophagitis: Secondary | ICD-10-CM | POA: Diagnosis not present

## 2022-10-13 DIAGNOSIS — Z1211 Encounter for screening for malignant neoplasm of colon: Secondary | ICD-10-CM | POA: Diagnosis not present

## 2022-10-13 DIAGNOSIS — R030 Elevated blood-pressure reading, without diagnosis of hypertension: Secondary | ICD-10-CM | POA: Diagnosis not present

## 2022-10-13 DIAGNOSIS — R0609 Other forms of dyspnea: Secondary | ICD-10-CM | POA: Diagnosis not present

## 2022-11-04 ENCOUNTER — Encounter: Payer: Self-pay | Admitting: Cardiology

## 2022-11-04 ENCOUNTER — Ambulatory Visit: Payer: Medicare Other | Admitting: Cardiology

## 2022-11-04 VITALS — BP 167/83 | HR 88 | Resp 16 | Ht 72.0 in | Wt 188.0 lb

## 2022-11-04 DIAGNOSIS — I10 Essential (primary) hypertension: Secondary | ICD-10-CM | POA: Diagnosis not present

## 2022-11-04 DIAGNOSIS — R0609 Other forms of dyspnea: Secondary | ICD-10-CM | POA: Insufficient documentation

## 2022-11-04 MED ORDER — LOSARTAN POTASSIUM 50 MG PO TABS: 50.0000 mg | ORAL_TABLET | Freq: Every day | ORAL | 2 refills | Status: DC

## 2022-11-04 NOTE — Progress Notes (Signed)
Patient referred by Tally Joe, MD for exertional dyspnea  Subjective:   Christopher Turner, male    DOB: 07-25-1945, 77 y.o.   MRN: [id number]   Chief Complaint  Patient presents with   Shortness of Breath   New Patient (Initial Visit)     HPI  77 y.o. Caucasian male with type 2 DM, arthritis, hiatal hernia, exertional dyspnea  Patient is retired. He has had diabetes for many years, currently on Farxiga. He did not tolerate metformin or Jardiance in the past. He is known to have a large hiatal hernia for which he is going to see a Careers adviser soon. Patient has had exertional dyspnea with minimal activity, denies chest pain or chest pressure.e blood pressure is elevated. He is currently not on any antihypertensive therapy.   Past Medical History:  Diagnosis Date   Arthritis    arthritis right hip    COPD (chronic obstructive pulmonary disease) (HCC)    23 yrs ago dx wtih COPD no problems now    DM type 2 causing renal disease (HCC)    Elevated LDL cholesterol level    GERD (gastroesophageal reflux disease)    No longer an issue   OAB (overactive bladder)    Pneumonia      Past Surgical History:  Procedure Laterality Date   CATARACT EXTRACTION Bilateral 2008, 2010   CIRCUMCISION N/A 09/24/2020   Procedure: CIRCUMCISION ADULT;  Surgeon: Belva Agee, MD;  Location: [facility];  Service: Urology;  Laterality: N/A;  45 MINS   OTHER SURGICAL HISTORY     trapped nerve surgery right scapula as teenager    OTHER SURGICAL HISTORY     vesicocele    TOTAL HIP ARTHROPLASTY  07/03/2011   Procedure: TOTAL HIP ARTHROPLASTY ANTERIOR APPROACH;  Surgeon: Kathryne Hitch;  Location: [facility];  Service: Orthopedics;  Laterality: Right;     Social History   Tobacco Use  Smoking Status Former   Types: Cigarettes   Quit date: 06/22/1988   Years since quitting: 34.3  Smokeless Tobacco Never    Social History   Substance and Sexual Activity  Alcohol Use No     Family History   Problem Relation Age of Onset   Diabetes Mellitus II Mother    Cancer - Lung Father    Diabetes Mellitus II Sister    CVA Sister    Hypertension Sister    CVA Brother    Diabetes Mellitus II Maternal Grandmother    CVA Maternal Grandfather    CVA Paternal Grandmother    CVA Paternal Grandfather    Colon cancer Neg Hx       Current Outpatient Medications:    atorvastatin (LIPITOR) 10 MG tablet, Take 10 mg by mouth daily., Disp: , Rfl:    dapagliflozin propanediol (FARXIGA) 10 MG TABS tablet, Take 10 mg by mouth daily., Disp: , Rfl:    ibuprofen (ADVIL) 200 MG tablet, Take 200 mg by mouth every 6 (six) hours as needed., Disp: , Rfl:    Multiple Vitamins-Minerals (MULTIVITAMIN WITH MINERALS) tablet, Take 1 tablet by mouth daily., Disp: , Rfl:    Cardiovascular and other pertinent studies:  Reviewed external labs and tests, independently interpreted  EKG 11/04/2022: Sinus rhythm 82 bpm Left axis deviation Occasional ectopic ventricular beat   Borderline left atrial enlargement  DG esophagus 08/11/2022: 1. Large paraesophageal hiatal hernia. 2. Moderate gastroesophageal reflux. 3. Mild esophageal dysmotility.    Recent labs: 08/07/2022: Glucose 107, BUN/Cr 17/1.3. EGFR 57.  Na/K 142/4.2. Rest of the CMP normal H/H 15/47. MCV 92. Platelets 213 HbA1C 8.5% Chol 121, TG 127, HDL 42, LDL 56 TSH 1.8 normal   Review of Systems  Cardiovascular:  Positive for dyspnea on exertion. Negative for chest pain, leg swelling, palpitations and syncope.        Vitals:   11/04/22 1317 11/04/22 1324  BP: (!) 146/86 (!) 167/83  Pulse: 84 88  Resp: 16   SpO2: 96% 96%     Body mass index is 25.5 kg/m. Filed Weights   11/04/22 1317  Weight: 188 lb (85.3 kg)     Objective:   Physical Exam Vitals and nursing note reviewed.  Constitutional:      General: He is not in acute distress. Neck:     Vascular: No JVD.  Cardiovascular:     Rate and Rhythm: Normal rate and regular  rhythm.     Heart sounds: Normal heart sounds. No murmur heard. Pulmonary:     Effort: Pulmonary effort is normal.     Breath sounds: Normal breath sounds. No wheezing or rales.  Musculoskeletal:     Right lower leg: No edema.     Left lower leg: No edema.          Visit diagnoses:   ICD-10-CM   1. Exertional dyspnea  R06.09 EKG 12-Lead    PCV MYOCARDIAL PERFUSION WO LEXISCAN    PCV ECHOCARDIOGRAM COMPLETE    2. Essential hypertension  I10 Basic metabolic panel    PCV MYOCARDIAL PERFUSION WO LEXISCAN    PCV ECHOCARDIOGRAM COMPLETE       Orders Placed This Encounter  Procedures   Basic metabolic panel   PCV MYOCARDIAL PERFUSION WO LEXISCAN   EKG 12-Lead   PCV ECHOCARDIOGRAM COMPLETE     Medication changes this visit: There are no discontinued medications.  Meds ordered this encounter  Medications   losartan (COZAAR) 50 MG tablet    Sig: Take 1 tablet (50 mg total) by mouth daily.    Dispense:  30 tablet    Refill:  2     Assessment & Recommendations:   77 y.o. Caucasian male with type 2 DM, arthritis, hiatal hernia, exertional dyspnea  Exertional dyspnea: Most likely etiology is large paraesophageal hernia. However, given his diabetes, angina equivalent is a possibility. Will check echocardiogram and exercise nuclear stress test.   Hypertension: Given his diabetes, recommend losartan 50 mg daily. Check BMP in 1 week. Recommend liberal hydration.  DM: Uncontrolled. Defer management to Dr. Azucena Cecil  Further recommendations after above testing  Thank you for referring the patient to [place]. Please feel free to contact with any questions.   Elder Negus, MD Pager: [phone] Office: [phone]

## 2022-11-05 DIAGNOSIS — K449 Diaphragmatic hernia without obstruction or gangrene: Secondary | ICD-10-CM | POA: Diagnosis not present

## 2022-11-05 DIAGNOSIS — R0602 Shortness of breath: Secondary | ICD-10-CM | POA: Diagnosis not present

## 2022-11-05 DIAGNOSIS — I10 Essential (primary) hypertension: Secondary | ICD-10-CM | POA: Diagnosis not present

## 2022-11-06 ENCOUNTER — Telehealth: Payer: Self-pay

## 2022-11-06 LAB — BASIC METABOLIC PANEL
BUN/Creatinine Ratio: 8 — ABNORMAL LOW (ref 10–24)
BUN: 12 mg/dL (ref 8–27)
CO2: 25 mmol/L (ref 20–29)
Calcium: 9.9 mg/dL (ref 8.6–10.2)
Chloride: 101 mmol/L (ref 96–106)
Creatinine, Ser: 1.43 mg/dL — ABNORMAL HIGH (ref 0.76–1.27)
Glucose: 97 mg/dL (ref 70–99)
Potassium: 4.4 mmol/L (ref 3.5–5.2)
Sodium: 142 mmol/L (ref 134–144)
eGFR: 50 mL/min/{1.73_m2} — ABNORMAL LOW (ref >59)

## 2022-11-06 NOTE — Telephone Encounter (Signed)
Patient is concerned that his bp is between 100/61 and 108/60 he is feeling dizzy and off balanced. Should he continue to take the medication? I discussed keeping hydrated with the patient, he said he feels maybe he hasn't been in taking enough fluid. Wants to know what you want his bp to be?

## 2022-11-06 NOTE — Telephone Encounter (Signed)
I started him on losartan 50 mg. Recommend holding it for next two days. Then, if SBP >140 mmHg, resume lsoartan at 1/2 tab of 50 mg that is 25 mg daily.  Thanks MJP

## 2022-11-06 NOTE — Telephone Encounter (Signed)
Discussed with patient Dr. Damian Leavell recommendations he verbalized understanding.

## 2022-11-12 ENCOUNTER — Ambulatory Visit: Payer: Medicare Other

## 2022-11-12 DIAGNOSIS — R0609 Other forms of dyspnea: Secondary | ICD-10-CM

## 2022-11-12 DIAGNOSIS — I10 Essential (primary) hypertension: Secondary | ICD-10-CM

## 2022-11-16 LAB — PCV MYOCARDIAL PERFUSION WO LEXISCAN
Angina Index: 0
ST Depression (mm): 0 mm

## 2022-12-02 ENCOUNTER — Ambulatory Visit: Payer: Medicare Other | Admitting: Pulmonary Disease

## 2022-12-02 ENCOUNTER — Encounter: Payer: Self-pay | Admitting: Pulmonary Disease

## 2022-12-02 VITALS — BP 124/82 | HR 68 | Ht 72.0 in | Wt 185.0 lb

## 2022-12-02 DIAGNOSIS — K449 Diaphragmatic hernia without obstruction or gangrene: Secondary | ICD-10-CM

## 2022-12-02 DIAGNOSIS — R0602 Shortness of breath: Secondary | ICD-10-CM

## 2022-12-02 DIAGNOSIS — J449 Chronic obstructive pulmonary disease, unspecified: Secondary | ICD-10-CM | POA: Diagnosis not present

## 2022-12-02 MED ORDER — TRELEGY ELLIPTA 100-62.5-25 MCG/ACT IN AEPB: 1.0000 | INHALATION_SPRAY | Freq: Every day | RESPIRATORY_TRACT | 0 refills | Status: DC

## 2022-12-02 NOTE — Patient Instructions (Addendum)
We will schedule you for pulmonary function test at the earliest possible date and time  We will schedule you for a CT Chest scan to evaluate your lungs and hiatal hernia  Start trelegy ellipta 1 puff daily and monitor for improvement in your breathing - rinse mouth out after each use - let us know if your shortness of breath is better and want to continue this inhaler  Follow up in 2 months
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
## 2022-12-04 ENCOUNTER — Ambulatory Visit: Payer: Medicare Other

## 2022-12-04 DIAGNOSIS — I10 Essential (primary) hypertension: Secondary | ICD-10-CM | POA: Diagnosis not present

## 2022-12-04 DIAGNOSIS — R0609 Other forms of dyspnea: Secondary | ICD-10-CM

## 2022-12-12 ENCOUNTER — Other Ambulatory Visit (HOSPITAL_BASED_OUTPATIENT_CLINIC_OR_DEPARTMENT_OTHER): Payer: Medicare Other

## 2022-12-14 ENCOUNTER — Ambulatory Visit: Payer: Medicare Other | Admitting: Cardiology

## 2022-12-16 ENCOUNTER — Ambulatory Visit (HOSPITAL_COMMUNITY)
Admission: RE | Admit: 2022-12-16 | Discharge: 2022-12-16 | Disposition: A | Discharge status: Home / Self Care | Payer: Medicare Other | Source: Ambulatory Visit | Attending: Pulmonary Disease | Admitting: Pulmonary Disease

## 2022-12-16 DIAGNOSIS — R0602 Shortness of breath: Secondary | ICD-10-CM | POA: Insufficient documentation

## 2022-12-16 DIAGNOSIS — J432 Centrilobular emphysema: Secondary | ICD-10-CM | POA: Diagnosis not present

## 2022-12-16 DIAGNOSIS — K449 Diaphragmatic hernia without obstruction or gangrene: Secondary | ICD-10-CM | POA: Insufficient documentation

## 2022-12-16 DIAGNOSIS — R0689 Other abnormalities of breathing: Secondary | ICD-10-CM | POA: Diagnosis not present

## 2022-12-18 ENCOUNTER — Encounter: Payer: Self-pay | Admitting: Pulmonary Disease

## 2022-12-18 ENCOUNTER — Encounter: Payer: Self-pay | Admitting: Cardiology

## 2022-12-18 ENCOUNTER — Ambulatory Visit: Payer: Medicare Other | Admitting: Cardiology

## 2022-12-18 ENCOUNTER — Other Ambulatory Visit: Payer: Self-pay

## 2022-12-18 VITALS — BP 142/84 | HR 76 | Ht 72.0 in | Wt 186.0 lb

## 2022-12-18 DIAGNOSIS — R0609 Other forms of dyspnea: Secondary | ICD-10-CM

## 2022-12-18 DIAGNOSIS — I10 Essential (primary) hypertension: Secondary | ICD-10-CM | POA: Diagnosis not present

## 2022-12-18 DIAGNOSIS — I77810 Thoracic aortic ectasia: Secondary | ICD-10-CM

## 2022-12-18 MED ORDER — TRELEGY ELLIPTA 100-62.5-25 MCG/ACT IN AEPB: 1.0000 | INHALATION_SPRAY | Freq: Every day | RESPIRATORY_TRACT | 6 refills | Status: DC

## 2022-12-18 MED ORDER — LOSARTAN POTASSIUM 25 MG PO TABS: 25.0000 mg | ORAL_TABLET | Freq: Every day | ORAL | 2 refills | Status: DC

## 2022-12-18 NOTE — Progress Notes (Signed)
Patient referred by Tally Joe, MD for exertional dyspnea  Subjective:   Christopher Turner, male    DOB: Apr 19, 1946, 77 y.o.   MRN: [id number]   Chief Complaint  Patient presents with   Shortness of Breath   Follow-up   Results     HPI  77 y.o. Caucasian male with hypertension, type 2 DM, arthritis, hiatal hernia, exertional dyspnea  Shortness of breath has improved after addition of Trelegy by his pulmonologist.  He could not tolerate losartan due to dizziness and low blood pressure.  Initial consultation visit 10/2022: Patient is retired. He has had diabetes for many years, currently on Farxiga. He did not tolerate metformin or Jardiance in the past. He is known to have a large hiatal hernia for which he is going to see a Careers adviser soon. Patient has had exertional dyspnea with minimal activity, denies chest pain or chest pressure.e blood pressure is elevated. He is currently not on any antihypertensive therapy.    Current Outpatient Medications:    atorvastatin (LIPITOR) 10 MG tablet, Take 10 mg by mouth daily., Disp: , Rfl:    dapagliflozin propanediol (FARXIGA) 10 MG TABS tablet, Take 10 mg by mouth daily., Disp: , Rfl:    Fluticasone-Umeclidin-Vilant (TRELEGY ELLIPTA) 100-62.5-25 MCG/ACT AEPB, Inhale 1 puff into the lungs daily., Disp: 2 each, Rfl: 0   ibuprofen (ADVIL) 200 MG tablet, Take 200 mg by mouth every 6 (six) hours as needed., Disp: , Rfl:    Multiple Vitamins-Minerals (MULTIVITAMIN WITH MINERALS) tablet, Take 1 tablet by mouth daily., Disp: , Rfl:    Cardiovascular and other pertinent studies:  Reviewed external labs and tests, independently interpreted  EKG 11/04/2022: Sinus rhythm 82 bpm Left axis deviation Occasional ectopic ventricular beat   Borderline left atrial enlargement  Echocardiogram 12/04/2022: Left ventricle cavity is normal in size. Normal left ventricular wall thickness. Normal global wall motion. Normal LV systolic function with EF 67%.  Doppler evidence of grade I (impaired) diastolic dysfunction, normal LAP. The aortic root dilated, measuring 4.2 cm at sinus of Valsalva Left atrial cavity is mildly dilated. Structurally normal trileaflet aortic valve. Mild (Grade I) aortic regurgitation. Mild pulmonic regurgitation. No evidence of pulmonary hypertension.  Exercise nuclear stress test 11/12/2022: Myocardial perfusion is normal. Overall LV systolic function is normal without regional wall motion abnormalities. Stress LV EF: 78%.  Normal ECG stress. The patient exercised for 4 minutes and 59 seconds of a Bruce protocol, achieving approximately 7.03 METs &  87% MPHR. The blood pressure response was normal. No previous exam available for comparison. Low risk.      DG esophagus 08/11/2022: 1. Large paraesophageal hiatal hernia. 2. Moderate gastroesophageal reflux. 3. Mild esophageal dysmotility.    Recent labs: 11/05/2022: Glucose 97, BUN/Cr 12/1.43. EGFR 50. Na/K 142/4.4.   08/07/2022: Glucose 107, BUN/Cr 17/1.3. EGFR 57. Na/K 142/4.2. Rest of the CMP normal H/H 15/47. MCV 92. Platelets 213 HbA1C 8.5% Chol 121, TG 127, HDL 42, LDL 56 TSH 1.8 normal   Review of Systems  Cardiovascular:  Positive for dyspnea on exertion. Negative for chest pain, leg swelling, palpitations and syncope.        Vitals:   12/18/22 0822 12/18/22 0830  BP: (!) 148/87 (!) 142/84  Pulse: 74 76  SpO2: 90%      Body mass index is 25.23 kg/m. Filed Weights   12/18/22 0822  Weight: 186 lb (84.4 kg)     Objective:   Physical Exam Vitals and nursing note reviewed.  Constitutional:  General: He is not in acute distress. Neck:     Vascular: No JVD.  Cardiovascular:     Rate and Rhythm: Normal rate and regular rhythm.     Heart sounds: Normal heart sounds. No murmur heard. Pulmonary:     Effort: Pulmonary effort is normal.     Breath sounds: Normal breath sounds. No wheezing or rales.  Musculoskeletal:     Right lower  leg: No edema.     Left lower leg: No edema.          Visit diagnoses:   ICD-10-CM   1. Exertional dyspnea  R06.09     2. Essential hypertension  I10 Basic metabolic panel    losartan (COZAAR) 25 MG tablet    DISCONTINUED: losartan (COZAAR) 25 MG tablet    3. Dilated aortic root (HCC)  I77.810 Basic metabolic panel    losartan (COZAAR) 25 MG tablet    PCV ECHOCARDIOGRAM COMPLETE    DISCONTINUED: losartan (COZAAR) 25 MG tablet       Orders Placed This Encounter  Procedures   Basic metabolic panel   PCV ECHOCARDIOGRAM COMPLETE     Medication changes this visit: Medications Discontinued During This Encounter  Medication Reason   losartan (COZAAR) 25 MG tablet Reorder     Assessment & Recommendations:   77 y.o. Caucasian male with hypertension, type 2 DM, arthritis, hiatal hernia, exertional dyspnea  Exertional dyspnea: Normal EF, grade 1 diastolic dysfunction.  Stress test with no ischemia. COPD and paraesophageal hernia more likely causes of exertional dyspnea. Continue follow-up with pulmonology.  Hypertension: Did not tolerate losartan 50 mg due to low blood pressure and dizziness. With his aortic root and diabetes, he would still benefit from being on at least small dose of losartan. Prescribe losartan 25 mg daily, with liberal hydration. Repeat BMP in 1 week. If tolerates, this could be referred by PCP Dr. Azucena Cecil going forward.  Dilated aortic root: 4.2 cm at sinus of Valsalva (echocardiogram 10/2022). He underwent CT chest to evaluate for his paraesophageal hernia 2 days ago.  This should give him some idea about the aorta size. I will repeat echocardiogram in 1 year.  DM: Uncontrolled. Defer management to Dr. Azucena Cecil  Further recommendations after above testing  Thank you for referring the patient to [place]. Please feel free to contact with any questions.   Elder Negus, MD Pager: [phone] Office: [phone]

## 2022-12-20 ENCOUNTER — Encounter: Payer: Self-pay | Admitting: Pulmonary Disease

## 2022-12-22 DIAGNOSIS — I10 Essential (primary) hypertension: Secondary | ICD-10-CM | POA: Diagnosis not present

## 2022-12-22 DIAGNOSIS — I77810 Thoracic aortic ectasia: Secondary | ICD-10-CM | POA: Diagnosis not present

## 2022-12-23 LAB — BASIC METABOLIC PANEL
BUN/Creatinine Ratio: 12 (ref 10–24)
BUN: 17 mg/dL (ref 8–27)
CO2: 24 mmol/L (ref 20–29)
Calcium: 9.4 mg/dL (ref 8.6–10.2)
Chloride: 102 mmol/L (ref 96–106)
Creatinine, Ser: 1.43 mg/dL — ABNORMAL HIGH (ref 0.76–1.27)
Glucose: 181 mg/dL — ABNORMAL HIGH (ref 70–99)
Potassium: 4.2 mmol/L (ref 3.5–5.2)
Sodium: 141 mmol/L (ref 134–144)
eGFR: 50 mL/min/{1.73_m2} — ABNORMAL LOW (ref >59)

## 2022-12-25 NOTE — Telephone Encounter (Signed)
Received the following message from patient:   "just received my blood work results from this week showed 181 blood sugar could this be from Trelegy-Ellipa, this morning it was 165 did not use the inhaler yet."  Dr. Francine Graven, can you please advise? Thanks!

## 2023-02-11 DIAGNOSIS — D692 Other nonthrombocytopenic purpura: Secondary | ICD-10-CM | POA: Diagnosis not present

## 2023-02-11 DIAGNOSIS — I7143 Infrarenal abdominal aortic aneurysm, without rupture: Secondary | ICD-10-CM | POA: Diagnosis not present

## 2023-02-11 DIAGNOSIS — J449 Chronic obstructive pulmonary disease, unspecified: Secondary | ICD-10-CM | POA: Diagnosis not present

## 2023-02-11 DIAGNOSIS — I77810 Thoracic aortic ectasia: Secondary | ICD-10-CM | POA: Diagnosis not present

## 2023-02-11 DIAGNOSIS — K219 Gastro-esophageal reflux disease without esophagitis: Secondary | ICD-10-CM | POA: Diagnosis not present

## 2023-02-11 DIAGNOSIS — E1122 Type 2 diabetes mellitus with diabetic chronic kidney disease: Secondary | ICD-10-CM | POA: Diagnosis not present

## 2023-02-11 DIAGNOSIS — N1831 Chronic kidney disease, stage 3a: Secondary | ICD-10-CM | POA: Diagnosis not present

## 2023-02-11 DIAGNOSIS — N3281 Overactive bladder: Secondary | ICD-10-CM | POA: Diagnosis not present

## 2023-02-11 DIAGNOSIS — Z1211 Encounter for screening for malignant neoplasm of colon: Secondary | ICD-10-CM | POA: Diagnosis not present

## 2023-02-11 DIAGNOSIS — E7800 Pure hypercholesterolemia, unspecified: Secondary | ICD-10-CM | POA: Diagnosis not present

## 2023-03-16 ENCOUNTER — Ambulatory Visit: Payer: Medicare Other | Admitting: Pulmonary Disease

## 2023-03-16 ENCOUNTER — Encounter: Payer: Self-pay | Admitting: Pulmonary Disease

## 2023-03-16 VITALS — BP 130/64 | HR 93 | Ht 71.0 in | Wt 187.8 lb

## 2023-03-16 DIAGNOSIS — K449 Diaphragmatic hernia without obstruction or gangrene: Secondary | ICD-10-CM

## 2023-03-16 DIAGNOSIS — R0602 Shortness of breath: Secondary | ICD-10-CM

## 2023-03-16 DIAGNOSIS — J432 Centrilobular emphysema: Secondary | ICD-10-CM | POA: Diagnosis not present

## 2023-03-16 DIAGNOSIS — J449 Chronic obstructive pulmonary disease, unspecified: Secondary | ICD-10-CM

## 2023-03-16 LAB — PULMONARY FUNCTION TEST
DL/VA % pred: 67 %
DL/VA: 2.64 ml/min/mmHg/L
DLCO cor % pred: 56 %
DLCO cor: 14.55 ml/min/mmHg
DLCO unc % pred: 56 %
DLCO unc: 14.55 ml/min/mmHg
FEF 25-75 Post: 1.44 L/sec
FEF 25-75 Pre: 0.97 L/sec
FEF2575-%Change-Post: 47 %
FEF2575-%Pred-Post: 64 %
FEF2575-%Pred-Pre: 43 %
FEV1-%Change-Post: 12 %
FEV1-%Pred-Post: 77 %
FEV1-%Pred-Pre: 68 %
FEV1-Post: 2.42 L
FEV1-Pre: 2.14 L
FEV1FVC-%Change-Post: 1 %
FEV1FVC-%Pred-Pre: 82 %
FEV6-%Change-Post: 11 %
FEV6-%Pred-Post: 94 %
FEV6-%Pred-Pre: 85 %
FEV6-Post: 3.85 L
FEV6-Pre: 3.46 L
FEV6FVC-%Change-Post: 0 %
FEV6FVC-%Pred-Post: 103 %
FEV6FVC-%Pred-Pre: 103 %
FVC-%Change-Post: 10 %
FVC-%Pred-Post: 92 %
FVC-%Pred-Pre: 83 %
FVC-Post: 3.98 L
FVC-Pre: 3.59 L
Post FEV1/FVC ratio: 61 %
Post FEV6/FVC ratio: 97 %
Pre FEV1/FVC ratio: 60 %
Pre FEV6/FVC Ratio: 97 %
RV % pred: 99 %
RV: 2.64 L
TLC % pred: 89 %
TLC: 6.52 L

## 2023-03-16 MED ORDER — ALBUTEROL SULFATE HFA 108 (90 BASE) MCG/ACT IN AERS
2.0000 | INHALATION_SPRAY | Freq: Four times a day (QID) | RESPIRATORY_TRACT | 6 refills | Status: AC | PRN
Start: 2023-03-16 — End: ?
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
## 2023-03-16 NOTE — Progress Notes (Signed)
Full PFT performed today. °

## 2023-03-16 NOTE — Patient Instructions (Addendum)
Your breathing tests show mild obstructive defect with significant albuterol response. There is also a mild diffusion defect.   Continue trelegy ellipta 1 puff daily - rinse mouth out after each use  Use albuterol inhaler 1-2 puffs every 4-6 hours as needed.   We will check an overnight oximetry test on room air  Follow up in 6 months

## 2023-03-16 NOTE — Patient Instructions (Signed)
Full PFT performed today. °

## 2023-03-24 ENCOUNTER — Encounter: Payer: Self-pay | Admitting: Pulmonary Disease

## 2023-04-19 DIAGNOSIS — G4730 Sleep apnea, unspecified: Secondary | ICD-10-CM | POA: Diagnosis not present

## 2023-04-19 DIAGNOSIS — R0902 Hypoxemia: Secondary | ICD-10-CM | POA: Diagnosis not present

## 2023-06-18 ENCOUNTER — Encounter: Payer: Self-pay | Admitting: Pulmonary Disease

## 2023-06-30 ENCOUNTER — Encounter: Payer: Self-pay | Admitting: Pulmonary Disease

## 2023-09-07 DIAGNOSIS — N1831 Chronic kidney disease, stage 3a: Secondary | ICD-10-CM | POA: Diagnosis not present

## 2023-09-07 DIAGNOSIS — N3281 Overactive bladder: Secondary | ICD-10-CM | POA: Diagnosis not present

## 2023-09-07 DIAGNOSIS — M1711 Unilateral primary osteoarthritis, right knee: Secondary | ICD-10-CM | POA: Diagnosis not present

## 2023-09-07 DIAGNOSIS — E1122 Type 2 diabetes mellitus with diabetic chronic kidney disease: Secondary | ICD-10-CM | POA: Diagnosis not present

## 2023-09-07 DIAGNOSIS — L853 Xerosis cutis: Secondary | ICD-10-CM | POA: Diagnosis not present

## 2023-09-07 DIAGNOSIS — K449 Diaphragmatic hernia without obstruction or gangrene: Secondary | ICD-10-CM | POA: Diagnosis not present

## 2023-09-07 DIAGNOSIS — Z0000 Encounter for general adult medical examination without abnormal findings: Secondary | ICD-10-CM | POA: Diagnosis not present

## 2023-09-07 DIAGNOSIS — Z23 Encounter for immunization: Secondary | ICD-10-CM | POA: Diagnosis not present

## 2023-09-07 DIAGNOSIS — I7143 Infrarenal abdominal aortic aneurysm, without rupture: Secondary | ICD-10-CM | POA: Diagnosis not present

## 2023-09-07 DIAGNOSIS — E7800 Pure hypercholesterolemia, unspecified: Secondary | ICD-10-CM | POA: Diagnosis not present

## 2023-12-17 ENCOUNTER — Other Ambulatory Visit: Payer: Medicare Other

## 2023-12-17 ENCOUNTER — Encounter (HOSPITAL_COMMUNITY): Payer: Self-pay

## 2023-12-17 ENCOUNTER — Ambulatory Visit (HOSPITAL_COMMUNITY): Payer: Medicare Other

## 2023-12-22 ENCOUNTER — Ambulatory Visit: Payer: Self-pay | Admitting: Cardiology

## 2024-01-26 ENCOUNTER — Other Ambulatory Visit: Payer: Self-pay | Admitting: *Deleted

## 2024-01-26 DIAGNOSIS — I77810 Thoracic aortic ectasia: Secondary | ICD-10-CM

## 2024-01-26 DIAGNOSIS — R0609 Other forms of dyspnea: Secondary | ICD-10-CM

## 2024-01-31 ENCOUNTER — Ambulatory Visit: Payer: Self-pay | Admitting: Cardiology

## 2024-01-31 ENCOUNTER — Ambulatory Visit (HOSPITAL_COMMUNITY)
Admission: RE | Admit: 2024-01-31 | Discharge: 2024-01-31 | Disposition: A | Discharge status: Home / Self Care | Source: Ambulatory Visit | Attending: Cardiology | Admitting: Cardiology

## 2024-01-31 DIAGNOSIS — R0609 Other forms of dyspnea: Secondary | ICD-10-CM | POA: Diagnosis not present

## 2024-01-31 DIAGNOSIS — I77810 Thoracic aortic ectasia: Secondary | ICD-10-CM | POA: Diagnosis not present

## 2024-01-31 LAB — ECHOCARDIOGRAM COMPLETE
Area-P 1/2: 5.46 cm2
P 1/2 time: 404 ms
S' Lateral: 2.1 cm

## 2024-03-09 DIAGNOSIS — J449 Chronic obstructive pulmonary disease, unspecified: Secondary | ICD-10-CM | POA: Diagnosis not present

## 2024-03-09 DIAGNOSIS — E1122 Type 2 diabetes mellitus with diabetic chronic kidney disease: Secondary | ICD-10-CM | POA: Diagnosis not present

## 2024-03-09 DIAGNOSIS — E7800 Pure hypercholesterolemia, unspecified: Secondary | ICD-10-CM | POA: Diagnosis not present

## 2024-03-09 DIAGNOSIS — K219 Gastro-esophageal reflux disease without esophagitis: Secondary | ICD-10-CM | POA: Diagnosis not present

## 2024-03-09 DIAGNOSIS — R030 Elevated blood-pressure reading, without diagnosis of hypertension: Secondary | ICD-10-CM | POA: Diagnosis not present

## 2024-03-09 DIAGNOSIS — Z23 Encounter for immunization: Secondary | ICD-10-CM | POA: Diagnosis not present

## 2024-03-09 DIAGNOSIS — E1142 Type 2 diabetes mellitus with diabetic polyneuropathy: Secondary | ICD-10-CM | POA: Diagnosis not present

## 2024-03-09 DIAGNOSIS — N3281 Overactive bladder: Secondary | ICD-10-CM | POA: Diagnosis not present

## 2024-03-09 DIAGNOSIS — M1711 Unilateral primary osteoarthritis, right knee: Secondary | ICD-10-CM | POA: Diagnosis not present

## 2024-03-09 DIAGNOSIS — D179 Benign lipomatous neoplasm, unspecified: Secondary | ICD-10-CM | POA: Diagnosis not present

## 2024-03-09 DIAGNOSIS — N1831 Chronic kidney disease, stage 3a: Secondary | ICD-10-CM | POA: Diagnosis not present

## 2024-05-09 NOTE — Progress Notes (Deleted)
 "     [name], PA-C [address] [place], [place]  [address] Phone: [phone]   Gastroenterology Consultation  Referring Provider:     [name], MD Primary Care Physician:  [name], MD Primary Gastroenterologist:  [name], PA-C / *** Reason for Consultation:     Gas        HPI:   Discussed the use of AI scribe software for clinical note transcription with the patient, who gave verbal consent to proceed.  New patient.  Here to evaluate gas.  PMH: COPD, type 2 diabetes, hypertension  History of Present Illness   GI history:  Patient saw [name] in 2024 to evaluate dysphagia.  Diagnosed with Barrett's esophagus.  Has history of diaphragmatic hernia and history of colon polyps.    Past Medical History:  Diagnosis Date   Arthritis    arthritis right hip    COPD (chronic obstructive pulmonary disease) (HCC)    23 yrs ago dx wtih COPD no problems now    DM type 2 causing renal disease (HCC)    Elevated LDL cholesterol level    GERD (gastroesophageal reflux disease)    No longer an issue   OAB (overactive bladder)    Pneumonia     Past Surgical History:  Procedure Laterality Date   CATARACT EXTRACTION Bilateral 2008, 2010   CIRCUMCISION N/A 09/24/2020   Procedure: CIRCUMCISION ADULT;  Surgeon: Rosalind Zachary NOVAK, MD;  Location: [facility];  Service: Urology;  Laterality: N/A;  45 MINS   OTHER SURGICAL HISTORY     trapped nerve surgery right scapula as teenager    OTHER SURGICAL HISTORY     vesicocele    TOTAL HIP ARTHROPLASTY  07/03/2011   Procedure: TOTAL HIP ARTHROPLASTY ANTERIOR APPROACH;  Surgeon: Lonni CINDERELLA Poli;  Location: [facility];  Service: Orthopedics;  Laterality: Right;    Prior to Admission medications   Medication Sig Start Date End Date Taking? Authorizing Provider  albuterol  (VENTOLIN  HFA) 108 (90 Base) MCG/ACT inhaler Inhale 2 puffs into the lungs every 6 (six) hours as needed for wheezing or shortness of breath. 03/16/23    [name], MD  atorvastatin (LIPITOR) 10 MG tablet Take 10 mg by mouth daily.    [provider]  dapagliflozin propanediol (FARXIGA) 10 MG TABS tablet Take 10 mg by mouth daily.    [provider]  Fluticasone-Umeclidin-Vilant (TRELEGY ELLIPTA ) 100-62.5-25 MCG/ACT AEPB Inhale 1 puff into the lungs daily. 12/18/22   [name], MD  ibuprofen (ADVIL) 200 MG tablet Take 200 mg by mouth every 6 (six) hours as needed.    [provider]  Multiple Vitamins-Minerals (MULTIVITAMIN WITH MINERALS) tablet Take 1 tablet by mouth daily.    [provider]    Family History  Problem Relation Age of Onset   Diabetes Mellitus II Mother    Cancer - Lung Father    Diabetes Mellitus II Sister    CVA Sister    Hypertension Sister    CVA Brother    Diabetes Mellitus II Maternal Grandmother    CVA Maternal Grandfather    CVA Paternal Grandmother    CVA Paternal Grandfather    Colon cancer Neg Hx      Social History   Tobacco Use   Smoking status: Former    Current packs/day: 0.00    Average packs/day: 1 pack/day for 15.0 years (15.0 ttl pk-yrs)    Types: Cigarettes    Start date: 06/22/1973  Quit date: 06/22/1988    Years since quitting: 35.9   Smokeless tobacco: Never  Vaping Use   Vaping status: Never Used  Substance Use Topics   Alcohol use: No   Drug use: No    Allergies as of 05/10/2024 - Review Complete 03/24/2023  Allergen Reaction Noted   Sulfa antibiotics Anaphylaxis 06/17/2011   Metformin hcl Diarrhea 08/08/2021    Review of Systems:    All systems reviewed and negative except where noted in HPI.   Physical Exam:  There were no vitals taken for this visit. No LMP for male patient.  General:   Alert,  Well-developed, well-nourished, pleasant and cooperative in NAD Lungs:  Respirations even and unlabored.  Clear throughout to auscultation.   No wheezes, crackles, or rhonchi. No acute distress. Heart:  Regular rate and  rhythm; no murmurs, clicks, rubs, or gallops. Abdomen:  Normal bowel sounds.  No bruits.  Soft, and non-distended without masses, hepatosplenomegaly or hernias noted.  No Tenderness.  No guarding or rebound tenderness.    Neurologic:  Alert and oriented x3;  grossly normal neurologically. Psych:  Alert and cooperative. Normal mood and affect.   Imaging Studies: No results found.  Labs: CBC    Component Value Date/Time   WBC 5.9 09/19/2020 1030   RBC 4.87 09/19/2020 1030   HGB 15.9 09/19/2020 1030   HCT 46.8 09/19/2020 1030   PLT 226 09/19/2020 1030   MCV 96.1 09/19/2020 1030    CMP     Component Value Date/Time   NA 141 12/22/2022 0839   K 4.2 12/22/2022 0839   CL 102 12/22/2022 0839   CO2 24 12/22/2022 0839   GLUCOSE 181 (H) 12/22/2022 0839   GLUCOSE 110 (H) 09/19/2020 1030   BUN 17 12/22/2022 0839   CREATININE 1.43 (H) 12/22/2022 0839   CALCIUM 9.4 12/22/2022 0839   GFRNONAA >60 09/19/2020 1030   GFRAA 75 (L) 07/04/2011 0610    Assessment and Plan:   MUHAMMAD VACCA is a 78 y.o. y/o male has been referred for ***  Assessment and Plan Assessment & Plan       Follow up ***  [name], PA-C   "

## 2024-05-10 ENCOUNTER — Ambulatory Visit: Admitting: Physician Assistant

## 2024-06-27 ENCOUNTER — Encounter: Payer: Self-pay | Admitting: Physician Assistant

## 2024-06-27 ENCOUNTER — Other Ambulatory Visit

## 2024-06-27 ENCOUNTER — Ambulatory Visit: Admitting: Physician Assistant

## 2024-06-27 VITALS — BP 122/78 | HR 95 | Ht 71.0 in | Wt 186.0 lb

## 2024-06-27 DIAGNOSIS — K5909 Other constipation: Secondary | ICD-10-CM

## 2024-06-27 DIAGNOSIS — R1084 Generalized abdominal pain: Secondary | ICD-10-CM | POA: Diagnosis not present

## 2024-06-27 DIAGNOSIS — R143 Flatulence: Secondary | ICD-10-CM

## 2024-06-27 DIAGNOSIS — R198 Other specified symptoms and signs involving the digestive system and abdomen: Secondary | ICD-10-CM

## 2024-06-27 DIAGNOSIS — R140 Abdominal distension (gaseous): Secondary | ICD-10-CM

## 2024-06-27 DIAGNOSIS — K219 Gastro-esophageal reflux disease without esophagitis: Secondary | ICD-10-CM

## 2024-06-27 DIAGNOSIS — K449 Diaphragmatic hernia without obstruction or gangrene: Secondary | ICD-10-CM

## 2024-06-27 DIAGNOSIS — J449 Chronic obstructive pulmonary disease, unspecified: Secondary | ICD-10-CM | POA: Insufficient documentation

## 2024-06-27 DIAGNOSIS — Z860100 Personal history of colon polyps, unspecified: Secondary | ICD-10-CM

## 2024-06-27 DIAGNOSIS — K5904 Chronic idiopathic constipation: Secondary | ICD-10-CM

## 2024-06-27 DIAGNOSIS — K2270 Barrett's esophagus without dysplasia: Secondary | ICD-10-CM

## 2024-06-27 LAB — COMPREHENSIVE METABOLIC PANEL WITH GFR
ALT: 15 U/L (ref 3–53)
AST: 16 U/L (ref 5–37)
Albumin: 4.5 g/dL (ref 3.5–5.2)
Alkaline Phosphatase: 70 U/L (ref 39–117)
BUN: 20 mg/dL (ref 6–23)
CO2: 33 meq/L — ABNORMAL HIGH (ref 19–32)
Calcium: 9.4 mg/dL (ref 8.4–10.5)
Chloride: 103 meq/L (ref 96–112)
Creatinine, Ser: 1.27 mg/dL (ref 0.40–1.50)
GFR: 53.98 mL/min — ABNORMAL LOW
Glucose, Bld: 107 mg/dL — ABNORMAL HIGH (ref 70–99)
Potassium: 4.1 meq/L (ref 3.5–5.1)
Sodium: 141 meq/L (ref 135–145)
Total Bilirubin: 1 mg/dL (ref 0.2–1.2)
Total Protein: 7.6 g/dL (ref 6.0–8.3)

## 2024-06-27 LAB — CBC WITH DIFFERENTIAL/PLATELET
Basophils Absolute: 0 K/uL (ref 0.0–0.1)
Basophils Relative: 0.5 % (ref 0.0–3.0)
Eosinophils Absolute: 0.1 K/uL (ref 0.0–0.7)
Eosinophils Relative: 2.2 % (ref 0.0–5.0)
HCT: 48.1 % (ref 39.0–52.0)
Hemoglobin: 16.3 g/dL (ref 13.0–17.0)
Lymphocytes Relative: 19.8 % (ref 12.0–46.0)
Lymphs Abs: 1.3 K/uL (ref 0.7–4.0)
MCHC: 33.9 g/dL (ref 30.0–36.0)
MCV: 92 fl (ref 78.0–100.0)
Monocytes Absolute: 0.8 K/uL (ref 0.1–1.0)
Monocytes Relative: 12.2 % — ABNORMAL HIGH (ref 3.0–12.0)
Neutro Abs: 4.4 K/uL (ref 1.4–7.7)
Neutrophils Relative %: 65.3 % (ref 43.0–77.0)
Platelets: 207 K/uL (ref 150.0–400.0)
RBC: 5.23 Mil/uL (ref 4.22–5.81)
RDW: 13.7 % (ref 11.5–15.5)
WBC: 6.7 K/uL (ref 4.0–10.5)

## 2024-06-27 MED ORDER — SENNOSIDES-DOCUSATE SODIUM 8.6-50 MG PO TABS: 2.0000 | ORAL_TABLET | Freq: Every day | ORAL | 5 refills | Status: AC

## 2024-06-27 NOTE — Progress Notes (Signed)
 Agree with assessment and plan as outlined.

## 2024-06-27 NOTE — Progress Notes (Signed)
 "     Christopher Console, PA-C [address] [place], [place]  [address] Phone: [phone]   Gastroenterology Consultation  Referring Provider:     [name], MD Primary Care Physician:  Christopher Lenis, MD Primary Gastroenterologist:  Christopher Console, PA-C / Christopher Naval, MD  Reason for Consultation:     Gas, Bloating        HPI:   Discussed the use of AI scribe software for clinical note transcription with the patient, who gave verbal consent to proceed.  Christopher Turner is a 79 year old male with hiatal hernia, type 2 diabetes mellitus, and chronic constipation who presents for evaluation of persistent abdominal bloating and gas.  Previous patient of myself and Christopher Turner at [facility], last seen by Christopher Turner 09/2022 to follow-up Barrett's esophagus without dysplasia, paraesophageal hernia, dark stool, and history of colon polyps.  Patient request to transfer care to our office.  Patient was evaluated by surgeon [name] 11/2022 to discuss repair of paraesophageal hiatal hernia.  Patient had shortness of breath thought related to the large hiatal hernia.  He had pulmonology evaluation by Christopher Turner and was started on inhaler for COPD with improvement.  Shortness of breath resolved on Breztri inhaler.  Hiatal hernia repair surgery was not advised unless patient had worsening upper GI symptoms.  07/2022 barium swallow with tablet: 1. Large paraesophageal hiatal hernia. 2. Moderate gastroesophageal reflux. 3. Mild esophageal dysmotility. History of Present Illness Abdominal bloating and flatulence - Persistent abdominal bloating and gas are primary concerns. - Sensation of tightness in the abdomen. - Palpable 'knots' in the abdomen, previously identified as fatty tissue. - Multiple over-the-counter gas remedies tried, including probiotics (Align), Beano, Gas-X, with little benefit; Only probiotics provide some relief. - Gurgling abdominal sounds cause embarrassment, especially in  public. - Nocturnal sensation of water swishing in the abdomen when lying down, resolving after several minutes. - No abdominal pain except when needing a bowel movement. - No nausea or vomiting.  Chronic constipation - Bowel movements typically twice daily. - Requires prolonged sitting on toilet and use of abdominal pressure to assist with defecation. - Stool consistency varies from large amounts to small, pellet-like stools. - No regular use of constipation medication. - Past use of Miralax and Dulcolax with good effect, but prefers not to use Miralax regularly. - Open to pill formulations due to difficulty tolerating liquids. - No current use of daily constipation medication.  Gastroesophageal reflux symptoms - Acid reflux occurs occasionally, primarily after consuming specific foods such as spaghetti and greasy meals. - Symptoms are rare and were absent during Thanksgiving. - Over-the-counter famotidine or Prilosec used as needed before trigger foods, with good effect. - No daily use of acid-suppressing medication. - No dysphagia, choking, or sensation of food getting stuck.  Hiatal hernia - Evaluated by a surgeon in 2024; surgery discussed but not pursued. - Started Breztri inhaler in 2041for COPD with Benefit. - No significant reflux or shortness of breath since then. - No recent increase in reflux symptoms.  Gastrointestinal bleeding and hemorrhoids - No current black stools. - One episode of black stool after a large dose of Pepto-Bismol and diarrhea, with no recurrence. - Two days of rectal bleeding four to five months ago, attributed to straining and a known hemorrhoid, resolved spontaneously.   PMH: COPD, aortic aneurysm, diabetes, GERD, hyperlipidemia, arthritis. 01/2024 echo LVEF 60 to 65%.  Former tobacco smoker, quit in 1990.  Cardiologist Christopher Turner.  Pulmonologist Christopher Turner.  PCP Dr. Alm Turner with [facility] physicians.  Past Medical History:  Diagnosis Date    Arthritis    arthritis right hip    COPD (chronic obstructive pulmonary disease) (HCC)    23 yrs ago dx wtih COPD no problems now    DM type 2 causing renal disease (HCC)    Elevated LDL cholesterol level    GERD (gastroesophageal reflux disease)    No longer an issue   OAB (overactive bladder)    Pneumonia     Past Surgical History:  Procedure Laterality Date   CATARACT EXTRACTION Bilateral 2008, 2010   CIRCUMCISION N/A 09/24/2020   Procedure: CIRCUMCISION ADULT;  Surgeon: Christopher Zachary NOVAK, MD;  Location: [facility];  Service: Urology;  Laterality: N/A;  45 MINS   OTHER SURGICAL HISTORY     trapped nerve surgery right scapula as teenager    OTHER SURGICAL HISTORY     vesicocele    TOTAL HIP ARTHROPLASTY  07/03/2011   Procedure: TOTAL HIP ARTHROPLASTY ANTERIOR APPROACH;  Surgeon: Christopher Turner;  Location: [facility];  Service: Orthopedics;  Laterality: Right;    Prior to Admission medications  Medication Sig Start Date End Date Taking? Authorizing Provider  atorvastatin (LIPITOR) 10 MG tablet Take 10 mg by mouth daily.   Yes [provider]  budesonide-glycopyrrolate -formoterol (BREZTRI AEROSPHERE) 160-9-4.8 MCG/ACT AERO inhaler 2 puffs as needed. 09/15/23  Yes [provider]  dapagliflozin propanediol (FARXIGA) 10 MG TABS tablet Take 10 mg by mouth daily.   Yes [provider]  diphenhydrAMINE  (BENADRYL  ALLERGY) 25 MG tablet at bedtime as needed.   Yes [provider]  ibuprofen (ADVIL) 200 MG tablet Take 200 mg by mouth every 6 (six) hours as needed.   Yes [provider]  Multiple Vitamins-Minerals (MULTIVITAMIN WITH MINERALS) tablet Take 1 tablet by mouth daily.   Yes [provider]  albuterol  (VENTOLIN  HFA) 108 (90 Base) MCG/ACT inhaler Inhale 2 puffs into the lungs every 6 (six) hours as needed for wheezing or shortness of breath. Patient not taking: Reported on 06/27/2024 03/16/23   Christopher Turner Dorn NOVAK, MD      Family  History  Problem Relation Age of Onset   Diabetes Mellitus II Mother    Cancer - Lung Father    Diabetes Mellitus II Sister    CVA Sister    Hypertension Sister    CVA Brother    Diabetes Mellitus II Maternal Grandmother    CVA Maternal Grandfather    CVA Paternal Grandmother    CVA Paternal Grandfather    Colon cancer Neg Hx      Social History[1]  Allergies as of 06/27/2024 - Review Complete 06/27/2024  Allergen Reaction Noted   Sulfa antibiotics Anaphylaxis 06/17/2011   Metformin hcl Diarrhea 08/08/2021    Review of Systems:    All systems reviewed and negative except where noted in HPI.   Physical Exam:  BP 122/78   Pulse 95   Ht 5' 11 (1.803 m)   Wt 186 lb (84.4 kg)   BMI 25.94 kg/m  No LMP for male patient.  General:   Alert,  Well-developed, well-nourished, pleasant and cooperative in NAD Lungs:  Respirations even and unlabored.  Clear throughout to auscultation.   No wheezes, crackles, or rhonchi. No acute distress. Heart:  Regular rate and rhythm; no murmurs, clicks, rubs, or gallops. Abdomen:  Normal bowel sounds.  No bruits.  Soft, and non-distended without masses, hepatosplenomegaly or hernias noted.  No Tenderness.  No  guarding or rebound tenderness.    Neurologic:  Alert and oriented x3;  grossly normal neurologically. Psych:  Alert and cooperative. Normal mood and affect.   Imaging Studies: No results found.  Labs: CBC    Component Value Date/Time   WBC 6.7 06/27/2024 0946   RBC 5.23 06/27/2024 0946   HGB 16.3 06/27/2024 0946   HCT 48.1 06/27/2024 0946   PLT 207.0 06/27/2024 0946   MCV 92.0 06/27/2024 0946    CMP     Component Value Date/Time   NA 141 06/27/2024 0946   NA 141 12/22/2022 0839   K 4.1 06/27/2024 0946   CL 103 06/27/2024 0946   CO2 33 (H) 06/27/2024 0946   GLUCOSE 107 (H) 06/27/2024 0946   BUN 20 06/27/2024 0946   BUN 17 12/22/2022 0839   CREATININE 1.27 06/27/2024 0946   CALCIUM 9.4 06/27/2024 0946   PROT 7.6  06/27/2024 0946   ALBUMIN 4.5 06/27/2024 0946   AST 16 06/27/2024 0946   ALT 15 06/27/2024 0946   ALKPHOS 70 06/27/2024 0946   BILITOT 1.0 06/27/2024 0946   GFRNONAA >60 09/19/2020 1030   GFRAA 75 (L) 07/04/2011 0610    Assessment and Plan:   MASAKI ROTHBAUER is a 79 y.o. y/o male has been referred for:   1.  Gas, bloating, increased bowel sounds.  Differential includes SIBO versus IBS. 2.  Chronic constipation 3.  GERD with Possible ?Barrett's esophagus without dysplasia. 4.  Large paraesophageal hernia: Currently asymptomatic; surgery not indicated at this time. 5.  History of colon polyps 6.  Comorbidities: COPD (improved/controlled on Breztri), AAA, diabetes, CKD  Plan: - Labs CBC, CMP - Ordered SIBO test. - Request EGD and colonoscopy records from [facility] - Decide about repeat EGD and colonoscopy after reviewing records from [facility] - If EGD showed Barrett's, then we will need to start patient on prescription PPI - Currently he is only taking OTC famotidine or Prilosec sporadically as needed for GERD. Assessment & Plan Constipation with abdominal bloating and gas Chronic constipation with variable stool consistency and bloating, partially responsive to probiotics and laxatives. - Continue Align probiotic daily. - Provided low FODMAP diet handout and dietary counseling. - Transitioned from Miralax to daily oral pill laxative. - Start Rx senn-docusate (Senokot-S) 8.6/50 mg 2 tablets once daily at bedtime, #60, 11 refills. - Advised fluid intake of 64 ounces daily. - Scheduled follow-up to reassess symptoms.  Hiatal hernia with gastroesophageal reflux Intermittent reflux well-controlled with famotidine. No surgical intervention needed due to minimal symptoms. - Continue over-the-counter famotidine as needed before meals. - Reinforced no need for surgical intervention. - Will review prior upper endoscopy reports to assess future medication and procedure needs. - If EGD  showed Barrett's, then we will need to start patient on prescription PPI  Evaluation for small intestinal bacterial overgrowth (SIBO) Persistent bloating and flatulence suggest possible SIBO; no prior testing documented. - Ordered SIBO breath test. - Initiate antibiotic therapy if SIBO test positive. - Ordered blood work for further assessment. - Obtained consent to review prior GI procedure reports.   Follow up in 6 weeks with TG.  Christopher Console, PA-C       [1]  Social History Tobacco Use   Smoking status: Former    Current packs/day: 0.00    Average packs/day: 1 pack/day for 15.0 years (15.0 ttl pk-yrs)    Types: Cigarettes    Start date: 06/22/1973    Quit date: 06/22/1988    Years since  quitting: 36.0   Smokeless tobacco: Never  Vaping Use   Vaping status: Never Used  Substance Use Topics   Alcohol use: No   Drug use: No   "

## 2024-06-27 NOTE — Patient Instructions (Addendum)
 Your provider has requested that you go to the basement level for lab work before leaving today. Press B on the elevator. The lab is located at the first door on the left as you exit the elevator.  We have sent the following medications to your pharmacy for you to pick up at your convenience: Senokot S 2 tablets once daily at bedtime   You have been given a testing kit to check for small intestine bacterial overgrowth (SIBO) which is completed by a company named [facility]. Make sure to return your test in the mail using the return mailing label given to you along with the kit. The test order, your demographic and insurance information have all already been sent to the company. [facility] will collect an upfront charge of $109.00 for commercial insurance plans and $229.00 if you are paying cash. The potential remaining total after claim submission and review is $120.00. Make sure to discuss with [facility] PRIOR to having the test to see if they have gotten information from your insurance company as to how much your testing will cost out of pocket, if any. Please contact [facility] at phone number [phone] to get instructions regarding how to perform the test as our office is unable to give specific testing instructions.   Please follow up sooner if symptoms increase or worsen  Due to recent changes in healthcare laws, you may see the results of your imaging and laboratory studies on MyChart before your provider has had a chance to review them.  We understand that in some cases there may be results that are confusing or concerning to you. Not all laboratory results come back in the same time frame and the provider may be waiting for multiple results in order to interpret others.  Please give us  48 hours in order for your provider to thoroughly review all the results before contacting the office for clarification of your results.   Thank you for trusting me with your  gastrointestinal care!   [name], PA-C _______________________________________________________  If your blood pressure at your visit was 140/90 or greater, please contact your primary care physician to follow up on this.  _______________________________________________________  If you are age 47 or older, your body mass index should be between 23-30. Your Body mass index is 25.94 kg/m. If this is out of the aforementioned range listed, please consider follow up with your Primary Care Provider.  If you are age 52 or younger, your body mass index should be between 19-25. Your Body mass index is 25.94 kg/m. If this is out of the aformentioned range listed, please consider follow up with your Primary Care Provider.   ________________________________________________________  The [facility] providers would like to encourage you to use MYCHART to communicate with providers for non-urgent requests or questions.  Due to long hold times on the telephone, sending your provider a message by [facility] may be a faster and more efficient way to get a response.  Please allow 48 business hours for a response.  Please remember that this is for non-urgent requests.  _______________________________________________________

## 2024-06-29 ENCOUNTER — Ambulatory Visit: Payer: Self-pay | Admitting: Physician Assistant

## 2024-07-12 ENCOUNTER — Telehealth: Payer: Self-pay | Admitting: Physician Assistant

## 2024-07-12 MED ORDER — RIFAXIMIN 550 MG PO TABS: 550.0000 mg | ORAL_TABLET | Freq: Three times a day (TID) | ORAL | 0 refills | Status: AC

## 2024-07-12 NOTE — Telephone Encounter (Signed)
-   Please call and notify patient hydrogen and methane breath test shows suspected bacterial overgrowth (SIBO). - I recommend treat with Xifaxan  550 mg 1 tablet 3 times daily for 14 days, #42, No RF. [name], PA-C

## 2024-07-12 NOTE — Telephone Encounter (Signed)
 Spoke with pt and he is aware of results and recommendations. Script sent to pharmacy.

## 2024-07-12 NOTE — Addendum Note (Signed)
 Addended by: PERLEY HANDING R on: 07/12/2024 01:40 PM   Modules accepted: Orders

## 2024-07-14 ENCOUNTER — Telehealth: Payer: Self-pay | Admitting: Physician Assistant

## 2024-07-14 NOTE — Telephone Encounter (Signed)
 Call from pt stating Rx rifaximin  (XIFAXAN ) is $2800 and is requesting alternative if possible. Please advise. Thank you

## 2024-07-14 NOTE — Telephone Encounter (Signed)
 Inbound call from patient stating that he is needing a prior auth for his Xifaxan  for his SIBO test. Patient is requesting to speak to the nurse in regards to this information. Please advise.

## 2024-07-17 MED ORDER — AMOXICILLIN-POT CLAVULANATE 875-125 MG PO TABS: 1.0000 | ORAL_TABLET | Freq: Two times a day (BID) | ORAL | 0 refills | Status: AC

## 2024-07-17 NOTE — Telephone Encounter (Signed)
 MyChart response sent to patient and augmentin  proscription sent to pharmacy.

## 2024-07-21 ENCOUNTER — Encounter: Payer: Self-pay | Admitting: Physician Assistant

## 2024-08-08 ENCOUNTER — Ambulatory Visit: Admitting: Physician Assistant
